# Patient Record
Sex: Female | Born: 1940 | Race: White | Hispanic: No | Smoking: Former smoker
Health system: Southern US, Community
[De-identification: ages and names within clinical notes are randomized; demographics above are authoritative.]

## PROBLEM LIST (undated history)

## (undated) DIAGNOSIS — Z8669 Personal history of other diseases of the nervous system and sense organs: Secondary | ICD-10-CM

## (undated) DIAGNOSIS — J449 Chronic obstructive pulmonary disease, unspecified: Secondary | ICD-10-CM

## (undated) DIAGNOSIS — E785 Hyperlipidemia, unspecified: Secondary | ICD-10-CM

## (undated) DIAGNOSIS — I10 Essential (primary) hypertension: Secondary | ICD-10-CM

## (undated) DIAGNOSIS — M1990 Unspecified osteoarthritis, unspecified site: Secondary | ICD-10-CM

## (undated) DIAGNOSIS — R209 Unspecified disturbances of skin sensation: Principal | ICD-10-CM

## (undated) DIAGNOSIS — K219 Gastro-esophageal reflux disease without esophagitis: Secondary | ICD-10-CM

## (undated) DIAGNOSIS — N189 Chronic kidney disease, unspecified: Secondary | ICD-10-CM

## (undated) HISTORY — DX: Hyperlipidemia, unspecified: E78.5

## (undated) HISTORY — DX: Essential (primary) hypertension: I10

## (undated) HISTORY — DX: Chronic obstructive pulmonary disease, unspecified: J44.9

## (undated) HISTORY — DX: Personal history of other diseases of the nervous system and sense organs: Z86.69

## (undated) HISTORY — DX: Unspecified osteoarthritis, unspecified site: M19.90

## (undated) HISTORY — PX: TUBAL LIGATION: SHX77

## (undated) HISTORY — DX: Chronic kidney disease, unspecified: N18.9

## (undated) HISTORY — PX: CHOLECYSTECTOMY: SHX55

## (undated) HISTORY — PX: TOTAL KNEE ARTHROPLASTY: SHX125

## (undated) HISTORY — DX: Unspecified disturbances of skin sensation: R20.9

## (undated) HISTORY — DX: Gastro-esophageal reflux disease without esophagitis: K21.9

## (undated) HISTORY — PX: OTHER SURGICAL HISTORY: SHX169

---

## 2013-10-31 ENCOUNTER — Encounter: Payer: Self-pay | Admitting: Neurology

## 2013-11-04 ENCOUNTER — Encounter: Payer: Self-pay | Admitting: Neurology

## 2013-11-04 ENCOUNTER — Ambulatory Visit (INDEPENDENT_AMBULATORY_CARE_PROVIDER_SITE_OTHER): Payer: Medicare Other | Admitting: Neurology

## 2013-11-04 VITALS — BP 139/80 | HR 62 | Ht 66.0 in | Wt 191.0 lb

## 2013-11-04 DIAGNOSIS — R209 Unspecified disturbances of skin sensation: Secondary | ICD-10-CM

## 2013-11-04 HISTORY — DX: Unspecified disturbances of skin sensation: R20.9

## 2013-11-04 NOTE — Progress Notes (Signed)
Reason for visit: Left hemisensory deficit  Kristin Petty is a 73 y.o. female  History of present illness:  Kristin Petty is a 73 year old right-handed white female with a history of hypertension and dyslipidemia. The patient noted onset of bilateral facial numbness that began 16 days prior to this evaluation. The patient had no other associated symptoms such as headache, double vision, slurred speech, or problems with swallowing. Within 3 days, she developed some numbness going down the left arm and the left leg. There is no associated clumsiness or weakness. She denies any gait instability issues. There was no confusion. Around that time, she was noted to have systolic blood pressures in the 210 or 220 range. The patient had an adjustment on her blood pressure medication since that time. She has had some gradual improvement of the numbness, but she still has some mild residual sensory changes around the mouth. The patient went to the emergency room, and a CAT scan of the brain was done and was unremarkable. The disc of the scan was brought for my review. The patient denies any neck pain. The patient is on low-dose aspirin, and was on aspirin at the time of the onset of his sensory deficit. She comes to this office for an evaluation.   Past Medical History  Diagnosis Date  . Hypertension   . Esophageal reflux   . Arthritis   . Disturbance of skin sensation 11/04/2013  . H/O Bell's palsy     right  . Dyslipidemia   . COPD (chronic obstructive pulmonary disease)   . Chronic renal insufficiency     Past Surgical History  Procedure Laterality Date  . Cholecystectomy    . Cesarean section    . Total knee arthroplasty    . Biopsy left breast    . Benign breast parenchyma    . Tubal ligation      Family History  Problem Relation Age of Onset  . Heart attack Father   . Alcoholism Father   . Alcoholism Mother     Social history:  reports that she has quit smoking. Her smoking use  included Cigarettes. She smoked 0.00 packs per day. She has never used smokeless tobacco. She reports that she does not drink alcohol or use illicit drugs.  Medications:  Current Outpatient Prescriptions on File Prior to Visit  Medication Sig Dispense Refill  . albuterol (PROAIR HFA) 108 (90 BASE) MCG/ACT inhaler Inhale 2 puffs into the lungs every 4 (four) hours as needed for wheezing or shortness of breath.      . Ascorbic Acid (VITAMIN C) 100 MG tablet Take 100 mg by mouth daily.      Marland Kitchen aspirin 325 MG tablet Take 325 mg by mouth daily.      . Calcium Carb-Cholecalciferol (CALCIUM 600 + D PO) Take 1 capsule by mouth 2 (two) times daily. 600 mg calcium + 400 vit d      . Fluticasone-Salmeterol (ADVAIR) 500-50 MCG/DOSE AEPB Inhale 1 puff into the lungs 2 (two) times daily.      . furosemide (LASIX) 20 MG tablet Take 20 mg by mouth daily. 10 mg daily      . ipratropium-albuterol (DUONEB) 0.5-2.5 (3) MG/3ML SOLN Take 3 mLs by nebulization every 4 (four) hours as needed (q 2-4 hrs prn).      . montelukast (SINGULAIR) 10 MG tablet Take 10 mg by mouth at bedtime.      . Omega-3 Fatty Acids (FISH OIL) 1000 MG CAPS Take  1 capsule by mouth daily.      . pravastatin (PRAVACHOL) 40 MG tablet Take 40 mg by mouth daily.      Marland Kitchen tiotropium (SPIRIVA) 18 MCG inhalation capsule Place 18 mcg into inhaler and inhale daily.       No current facility-administered medications on file prior to visit.      Allergies  Allergen Reactions  . Antihistamines, Chlorpheniramine-Type     ROS:  Out of a complete 14 system review of symptoms, the patient complains only of the following symptoms, and all other reviewed systems are negative.  Ringing in the ears  Shortness of breath, snoring Joint pain, muscle cramps Allergies Moles Numbness, tremor Sleepiness  Blood pressure 139/80, pulse 62, height 5\' 6"  (1.676 m), weight 191 lb (86.637 kg).  Physical Exam  General: The patient is alert and cooperative at  the time of the examination.  Eyes: Pupils are equal, round, and reactive to light. Discs are flat bilaterally.  Neck: The neck is supple, no carotid bruits are noted.  Respiratory: The respiratory examination is clear.  Cardiovascular: The cardiovascular examination reveals a regular rate and rhythm, no obvious murmurs or rubs are noted.  Skin: Extremities are without significant edema.  Neurologic Exam  Mental status: The patient is alert and oriented x 3 at the time of the examination. The patient has apparent normal recent and remote memory, with an apparently normal attention span and concentration ability.  Cranial nerves: Facial symmetry is present. There is good sensation of the face to pinprick and soft touch bilaterally. The strength of the facial muscles and the muscles to head turning and shoulder shrug are normal bilaterally. Speech is well enunciated, no aphasia or dysarthria is noted. Extraocular movements are full. Visual fields are full. The tongue is midline, and the patient has symmetric elevation of the soft palate. No obvious hearing deficits are noted.  Motor: The motor testing reveals 5 over 5 strength of all 4 extremities. Good symmetric motor tone is noted throughout.  Sensory: Sensory testing is intact to pinprick, soft touch, vibration sensation, and position sense on all 4 extremities, with exception that there is some decreased pinprick sensation on the left hand. No evidence of extinction is noted.  Coordination: Cerebellar testing reveals good finger-nose-finger and heel-to-shin bilaterally.  Gait and station: Gait is normal. Tandem gait is normal. Romberg is negative. No drift is seen.  Reflexes: Deep tendon reflexes are symmetric, but are depressed bilaterally. Toes are downgoing bilaterally.   Assessment/Plan:  One. Left hemisensory deficit  The patient had onset of a sensory deficit that came on over several days involving the left side of the body  primarily. The patient has had some issues with uncontrolled blood pressures around the time of sensory symptoms onset.  The patient may be at risk for small vessel disease affecting the brainstem area. She will be set up for MRI of the brain, and a carotid Doppler study. MRA of the head will be done. She will followup through this office if needed.   Jill Alexanders MD 11/04/2013 8:19 PM  [facility] [address] [place], [place] [address]  Phone [phone] Fax [phone]

## 2013-11-13 ENCOUNTER — Inpatient Hospital Stay: Admission: RE | Admit: 2013-11-13 | Payer: Self-pay | Source: Ambulatory Visit

## 2013-11-13 ENCOUNTER — Ambulatory Visit
Admission: RE | Admit: 2013-11-13 | Discharge: 2013-11-13 | Disposition: A | Discharge status: Home / Self Care | Payer: PRIVATE HEALTH INSURANCE | Source: Ambulatory Visit | Attending: Neurology | Admitting: Neurology

## 2013-11-13 DIAGNOSIS — R209 Unspecified disturbances of skin sensation: Secondary | ICD-10-CM

## 2013-11-19 ENCOUNTER — Ambulatory Visit (INDEPENDENT_AMBULATORY_CARE_PROVIDER_SITE_OTHER): Payer: Medicare Other

## 2013-11-19 ENCOUNTER — Telehealth: Payer: Self-pay | Admitting: Neurology

## 2013-11-19 DIAGNOSIS — R209 Unspecified disturbances of skin sensation: Secondary | ICD-10-CM

## 2013-11-19 MED ORDER — ALPRAZOLAM 0.5 MG PO TABS: ORAL_TABLET | ORAL | Status: DC

## 2013-11-19 NOTE — Telephone Encounter (Signed)
I called patient. The patient wants me to call in a small prescription for Xanax for the MRI study, I will do this.

## 2013-11-19 NOTE — Telephone Encounter (Signed)
Needs Xanax for MRI on monday

## 2013-11-19 NOTE — Telephone Encounter (Signed)
Patient requesting Xanax for scheduled MRI.

## 2013-11-24 ENCOUNTER — Ambulatory Visit
Admission: RE | Admit: 2013-11-24 | Discharge: 2013-11-24 | Disposition: A | Discharge status: Home / Self Care | Payer: PRIVATE HEALTH INSURANCE | Source: Ambulatory Visit | Attending: Neurology | Admitting: Neurology

## 2013-11-24 ENCOUNTER — Telehealth: Payer: Self-pay | Admitting: Neurology

## 2013-11-24 DIAGNOSIS — R209 Unspecified disturbances of skin sensation: Secondary | ICD-10-CM

## 2013-11-24 NOTE — Telephone Encounter (Signed)
I called patient. The carotid Doppler study shows 50-69% stenosis of the right mid internal carotid artery. No issues noted on the left. MRI and MRA of the head have been done, the formal results are pending. I will call the patient when I get the results. I'll recheck a carotid Doppler study in one year. The patient is on low-dose aspirin.

## 2013-11-25 ENCOUNTER — Telehealth: Payer: Self-pay | Admitting: Neurology

## 2013-11-25 NOTE — Telephone Encounter (Signed)
I called patient. MRI the brain shows nonspecific white matter changes, no acute changes seen, no acute stroke. The patient indicates that the sensory changes have improved once her blood pressure has come under good control. The patient likely had a vasospasm issue that resulted in the drainage and sensory alterations. Carotid Doppler study was unremarkable.     MRI brain 11/25/2013:  Impression   Abnormal MRI brain (without) demonstrating: 1. Few round and ovoid, periventricular and subcortical foci of T2  hyperintensities. These findings are non-specific and considerations  include autoimmune, inflammatory, post-infectious, or microvascular  ischemic etiologies. 2. No acute findings.   MRA head 11/25/2013:   IMPRESSION:  Normal MRA head (without).

## 2014-02-18 ENCOUNTER — Encounter: Payer: Self-pay | Admitting: Neurology

## 2014-02-24 ENCOUNTER — Encounter: Payer: Self-pay | Admitting: Neurology

## 2014-04-29 DIAGNOSIS — J449 Chronic obstructive pulmonary disease, unspecified: Secondary | ICD-10-CM | POA: Diagnosis not present

## 2014-05-15 DIAGNOSIS — R0781 Pleurodynia: Secondary | ICD-10-CM | POA: Diagnosis not present

## 2014-05-15 DIAGNOSIS — S76011D Strain of muscle, fascia and tendon of right hip, subsequent encounter: Secondary | ICD-10-CM | POA: Diagnosis not present

## 2014-05-22 DIAGNOSIS — M1611 Unilateral primary osteoarthritis, right hip: Secondary | ICD-10-CM | POA: Diagnosis not present

## 2014-05-28 DIAGNOSIS — D126 Benign neoplasm of colon, unspecified: Secondary | ICD-10-CM | POA: Diagnosis not present

## 2014-05-29 DIAGNOSIS — M25551 Pain in right hip: Secondary | ICD-10-CM | POA: Diagnosis not present

## 2014-05-29 DIAGNOSIS — R262 Difficulty in walking, not elsewhere classified: Secondary | ICD-10-CM | POA: Diagnosis not present

## 2014-05-29 DIAGNOSIS — M1611 Unilateral primary osteoarthritis, right hip: Secondary | ICD-10-CM | POA: Diagnosis not present

## 2014-05-30 DIAGNOSIS — J449 Chronic obstructive pulmonary disease, unspecified: Secondary | ICD-10-CM | POA: Diagnosis not present

## 2014-06-04 DIAGNOSIS — M169 Osteoarthritis of hip, unspecified: Secondary | ICD-10-CM | POA: Diagnosis not present

## 2014-06-04 DIAGNOSIS — M25551 Pain in right hip: Secondary | ICD-10-CM | POA: Diagnosis not present

## 2014-06-12 DIAGNOSIS — J45909 Unspecified asthma, uncomplicated: Secondary | ICD-10-CM | POA: Diagnosis not present

## 2014-06-12 DIAGNOSIS — Z9049 Acquired absence of other specified parts of digestive tract: Secondary | ICD-10-CM | POA: Diagnosis not present

## 2014-06-12 DIAGNOSIS — K6389 Other specified diseases of intestine: Secondary | ICD-10-CM | POA: Diagnosis not present

## 2014-06-12 DIAGNOSIS — Z86010 Personal history of colonic polyps: Secondary | ICD-10-CM | POA: Diagnosis not present

## 2014-06-12 DIAGNOSIS — I10 Essential (primary) hypertension: Secondary | ICD-10-CM | POA: Diagnosis not present

## 2014-06-12 DIAGNOSIS — K5730 Diverticulosis of large intestine without perforation or abscess without bleeding: Secondary | ICD-10-CM | POA: Diagnosis not present

## 2014-06-12 DIAGNOSIS — Z96659 Presence of unspecified artificial knee joint: Secondary | ICD-10-CM | POA: Diagnosis not present

## 2014-06-12 DIAGNOSIS — D124 Benign neoplasm of descending colon: Secondary | ICD-10-CM | POA: Diagnosis not present

## 2014-06-12 DIAGNOSIS — Z87891 Personal history of nicotine dependence: Secondary | ICD-10-CM | POA: Diagnosis not present

## 2014-06-19 DIAGNOSIS — M1611 Unilateral primary osteoarthritis, right hip: Secondary | ICD-10-CM | POA: Diagnosis not present

## 2014-06-19 DIAGNOSIS — M25551 Pain in right hip: Secondary | ICD-10-CM | POA: Diagnosis not present

## 2014-06-19 DIAGNOSIS — R262 Difficulty in walking, not elsewhere classified: Secondary | ICD-10-CM | POA: Diagnosis not present

## 2014-06-25 DIAGNOSIS — M1611 Unilateral primary osteoarthritis, right hip: Secondary | ICD-10-CM | POA: Diagnosis not present

## 2014-06-25 DIAGNOSIS — R262 Difficulty in walking, not elsewhere classified: Secondary | ICD-10-CM | POA: Diagnosis not present

## 2014-06-25 DIAGNOSIS — M25551 Pain in right hip: Secondary | ICD-10-CM | POA: Diagnosis not present

## 2014-06-28 DIAGNOSIS — J449 Chronic obstructive pulmonary disease, unspecified: Secondary | ICD-10-CM | POA: Diagnosis not present

## 2014-07-03 DIAGNOSIS — M25551 Pain in right hip: Secondary | ICD-10-CM | POA: Diagnosis not present

## 2014-07-03 DIAGNOSIS — R262 Difficulty in walking, not elsewhere classified: Secondary | ICD-10-CM | POA: Diagnosis not present

## 2014-07-03 DIAGNOSIS — M1611 Unilateral primary osteoarthritis, right hip: Secondary | ICD-10-CM | POA: Diagnosis not present

## 2014-07-10 DIAGNOSIS — R262 Difficulty in walking, not elsewhere classified: Secondary | ICD-10-CM | POA: Diagnosis not present

## 2014-07-10 DIAGNOSIS — M1611 Unilateral primary osteoarthritis, right hip: Secondary | ICD-10-CM | POA: Diagnosis not present

## 2014-07-10 DIAGNOSIS — M25551 Pain in right hip: Secondary | ICD-10-CM | POA: Diagnosis not present

## 2014-07-17 DIAGNOSIS — M25551 Pain in right hip: Secondary | ICD-10-CM | POA: Diagnosis not present

## 2014-07-17 DIAGNOSIS — R262 Difficulty in walking, not elsewhere classified: Secondary | ICD-10-CM | POA: Diagnosis not present

## 2014-07-17 DIAGNOSIS — M1611 Unilateral primary osteoarthritis, right hip: Secondary | ICD-10-CM | POA: Diagnosis not present

## 2014-07-24 DIAGNOSIS — R262 Difficulty in walking, not elsewhere classified: Secondary | ICD-10-CM | POA: Diagnosis not present

## 2014-07-24 DIAGNOSIS — M25551 Pain in right hip: Secondary | ICD-10-CM | POA: Diagnosis not present

## 2014-07-24 DIAGNOSIS — M1611 Unilateral primary osteoarthritis, right hip: Secondary | ICD-10-CM | POA: Diagnosis not present

## 2014-07-29 DIAGNOSIS — J449 Chronic obstructive pulmonary disease, unspecified: Secondary | ICD-10-CM | POA: Diagnosis not present

## 2014-08-28 DIAGNOSIS — J449 Chronic obstructive pulmonary disease, unspecified: Secondary | ICD-10-CM | POA: Diagnosis not present

## 2014-09-25 DIAGNOSIS — Z1231 Encounter for screening mammogram for malignant neoplasm of breast: Secondary | ICD-10-CM | POA: Diagnosis not present

## 2014-09-28 DIAGNOSIS — J449 Chronic obstructive pulmonary disease, unspecified: Secondary | ICD-10-CM | POA: Diagnosis not present

## 2014-10-20 DIAGNOSIS — J449 Chronic obstructive pulmonary disease, unspecified: Secondary | ICD-10-CM | POA: Diagnosis not present

## 2014-10-23 DIAGNOSIS — E780 Pure hypercholesterolemia: Secondary | ICD-10-CM | POA: Diagnosis not present

## 2014-10-23 DIAGNOSIS — Z0000 Encounter for general adult medical examination without abnormal findings: Secondary | ICD-10-CM | POA: Diagnosis not present

## 2014-10-23 DIAGNOSIS — I10 Essential (primary) hypertension: Secondary | ICD-10-CM | POA: Diagnosis not present

## 2014-10-23 DIAGNOSIS — Z79899 Other long term (current) drug therapy: Secondary | ICD-10-CM | POA: Diagnosis not present

## 2014-10-23 DIAGNOSIS — R0600 Dyspnea, unspecified: Secondary | ICD-10-CM | POA: Diagnosis not present

## 2014-10-28 DIAGNOSIS — J449 Chronic obstructive pulmonary disease, unspecified: Secondary | ICD-10-CM | POA: Diagnosis not present

## 2014-11-25 ENCOUNTER — Telehealth: Payer: Self-pay | Admitting: Neurology

## 2014-11-25 DIAGNOSIS — I6529 Occlusion and stenosis of unspecified carotid artery: Secondary | ICD-10-CM

## 2014-11-25 NOTE — Telephone Encounter (Signed)
I attempted to contact patient to reschedule carotid Doppler study for follow-up, both telephone numbers in epic are nonfunctional.

## 2014-11-26 NOTE — Telephone Encounter (Signed)
I called the patient and explained why Dr. Jannifer Franklin was calling. I let her know that Dr. Jannifer Franklin is out of the office until Monday. I explained that she would receive a call from Seth Bake to schedule the study once Dr. Jannifer Franklin places the order for it. She verbalized understanding.

## 2014-11-26 NOTE — Addendum Note (Signed)
Addended by: Margette Fast on: 11/26/2014 05:33 PM   Modules accepted: Orders

## 2014-11-26 NOTE — Telephone Encounter (Signed)
I called the patient. I discussed rechecking a carotid Doppler study. She is amenable to this. I will order the study.

## 2014-11-26 NOTE — Telephone Encounter (Signed)
The patient is returning a call and can be reached at [phone].

## 2014-11-28 DIAGNOSIS — J449 Chronic obstructive pulmonary disease, unspecified: Secondary | ICD-10-CM | POA: Diagnosis not present

## 2014-12-17 ENCOUNTER — Ambulatory Visit (INDEPENDENT_AMBULATORY_CARE_PROVIDER_SITE_OTHER): Payer: Medicare Other

## 2014-12-17 DIAGNOSIS — I6529 Occlusion and stenosis of unspecified carotid artery: Secondary | ICD-10-CM | POA: Diagnosis not present

## 2014-12-25 ENCOUNTER — Telehealth: Payer: Self-pay | Admitting: Neurology

## 2014-12-25 NOTE — Telephone Encounter (Signed)
Carotid Doppler study shows a stable stenosis of 50-69% stenosis in the right mid to proximal internal carotid artery. I discussed this with the patient.

## 2014-12-29 DIAGNOSIS — J449 Chronic obstructive pulmonary disease, unspecified: Secondary | ICD-10-CM | POA: Diagnosis not present

## 2015-01-28 DIAGNOSIS — J449 Chronic obstructive pulmonary disease, unspecified: Secondary | ICD-10-CM | POA: Diagnosis not present

## 2015-02-28 DIAGNOSIS — J449 Chronic obstructive pulmonary disease, unspecified: Secondary | ICD-10-CM | POA: Diagnosis not present

## 2015-03-22 DIAGNOSIS — E785 Hyperlipidemia, unspecified: Secondary | ICD-10-CM | POA: Diagnosis not present

## 2015-03-22 DIAGNOSIS — Z1389 Encounter for screening for other disorder: Secondary | ICD-10-CM | POA: Diagnosis not present

## 2015-03-22 DIAGNOSIS — Z79899 Other long term (current) drug therapy: Secondary | ICD-10-CM | POA: Diagnosis not present

## 2015-03-22 DIAGNOSIS — M25552 Pain in left hip: Secondary | ICD-10-CM | POA: Diagnosis not present

## 2015-03-30 DIAGNOSIS — J449 Chronic obstructive pulmonary disease, unspecified: Secondary | ICD-10-CM | POA: Diagnosis not present

## 2015-04-01 DIAGNOSIS — M1612 Unilateral primary osteoarthritis, left hip: Secondary | ICD-10-CM | POA: Diagnosis not present

## 2015-04-21 DIAGNOSIS — Z1389 Encounter for screening for other disorder: Secondary | ICD-10-CM | POA: Diagnosis not present

## 2015-04-21 DIAGNOSIS — M25552 Pain in left hip: Secondary | ICD-10-CM | POA: Diagnosis not present

## 2015-04-21 DIAGNOSIS — M79609 Pain in unspecified limb: Secondary | ICD-10-CM | POA: Diagnosis not present

## 2015-04-21 DIAGNOSIS — Z01810 Encounter for preprocedural cardiovascular examination: Secondary | ICD-10-CM | POA: Diagnosis not present

## 2015-04-21 DIAGNOSIS — Z79899 Other long term (current) drug therapy: Secondary | ICD-10-CM | POA: Diagnosis not present

## 2015-04-21 DIAGNOSIS — R9431 Abnormal electrocardiogram [ECG] [EKG]: Secondary | ICD-10-CM | POA: Diagnosis not present

## 2015-04-21 DIAGNOSIS — I4510 Unspecified right bundle-branch block: Secondary | ICD-10-CM | POA: Diagnosis not present

## 2015-04-21 DIAGNOSIS — Z01818 Encounter for other preprocedural examination: Secondary | ICD-10-CM | POA: Diagnosis not present

## 2015-04-21 DIAGNOSIS — R001 Bradycardia, unspecified: Secondary | ICD-10-CM | POA: Diagnosis not present

## 2015-04-22 DIAGNOSIS — I10 Essential (primary) hypertension: Secondary | ICD-10-CM | POA: Diagnosis not present

## 2015-04-22 DIAGNOSIS — E785 Hyperlipidemia, unspecified: Secondary | ICD-10-CM | POA: Diagnosis not present

## 2015-04-22 DIAGNOSIS — E7800 Pure hypercholesterolemia, unspecified: Secondary | ICD-10-CM | POA: Insufficient documentation

## 2015-04-22 DIAGNOSIS — Z87891 Personal history of nicotine dependence: Secondary | ICD-10-CM | POA: Diagnosis not present

## 2015-04-22 DIAGNOSIS — J410 Simple chronic bronchitis: Secondary | ICD-10-CM | POA: Diagnosis not present

## 2015-04-22 DIAGNOSIS — Z01810 Encounter for preprocedural cardiovascular examination: Secondary | ICD-10-CM | POA: Diagnosis not present

## 2015-04-27 DIAGNOSIS — J410 Simple chronic bronchitis: Secondary | ICD-10-CM | POA: Diagnosis not present

## 2015-04-27 DIAGNOSIS — E785 Hyperlipidemia, unspecified: Secondary | ICD-10-CM | POA: Diagnosis not present

## 2015-04-27 DIAGNOSIS — I10 Essential (primary) hypertension: Secondary | ICD-10-CM | POA: Diagnosis not present

## 2015-04-27 DIAGNOSIS — Z01810 Encounter for preprocedural cardiovascular examination: Secondary | ICD-10-CM | POA: Diagnosis not present

## 2015-04-27 DIAGNOSIS — Z87891 Personal history of nicotine dependence: Secondary | ICD-10-CM | POA: Diagnosis not present

## 2015-04-30 DIAGNOSIS — J449 Chronic obstructive pulmonary disease, unspecified: Secondary | ICD-10-CM | POA: Diagnosis not present

## 2015-05-10 DIAGNOSIS — M1612 Unilateral primary osteoarthritis, left hip: Secondary | ICD-10-CM | POA: Diagnosis not present

## 2015-05-19 DIAGNOSIS — M1652 Unilateral post-traumatic osteoarthritis, left hip: Secondary | ICD-10-CM | POA: Diagnosis not present

## 2015-05-19 DIAGNOSIS — Z7982 Long term (current) use of aspirin: Secondary | ICD-10-CM | POA: Diagnosis not present

## 2015-05-19 DIAGNOSIS — Z87891 Personal history of nicotine dependence: Secondary | ICD-10-CM | POA: Diagnosis not present

## 2015-05-19 DIAGNOSIS — Z471 Aftercare following joint replacement surgery: Secondary | ICD-10-CM | POA: Diagnosis not present

## 2015-05-19 DIAGNOSIS — Z96642 Presence of left artificial hip joint: Secondary | ICD-10-CM | POA: Diagnosis not present

## 2015-05-19 DIAGNOSIS — E785 Hyperlipidemia, unspecified: Secondary | ICD-10-CM | POA: Diagnosis not present

## 2015-05-19 DIAGNOSIS — M25552 Pain in left hip: Secondary | ICD-10-CM | POA: Diagnosis not present

## 2015-05-19 DIAGNOSIS — K219 Gastro-esophageal reflux disease without esophagitis: Secondary | ICD-10-CM | POA: Diagnosis not present

## 2015-05-19 DIAGNOSIS — E559 Vitamin D deficiency, unspecified: Secondary | ICD-10-CM | POA: Diagnosis not present

## 2015-05-19 DIAGNOSIS — J449 Chronic obstructive pulmonary disease, unspecified: Secondary | ICD-10-CM | POA: Diagnosis not present

## 2015-05-19 DIAGNOSIS — Z79899 Other long term (current) drug therapy: Secondary | ICD-10-CM | POA: Diagnosis not present

## 2015-05-19 DIAGNOSIS — M1612 Unilateral primary osteoarthritis, left hip: Secondary | ICD-10-CM | POA: Diagnosis not present

## 2015-05-19 DIAGNOSIS — J45909 Unspecified asthma, uncomplicated: Secondary | ICD-10-CM | POA: Diagnosis not present

## 2015-05-19 DIAGNOSIS — M1712 Unilateral primary osteoarthritis, left knee: Secondary | ICD-10-CM | POA: Diagnosis not present

## 2015-05-19 DIAGNOSIS — Z888 Allergy status to other drugs, medicaments and biological substances status: Secondary | ICD-10-CM | POA: Diagnosis not present

## 2015-05-19 DIAGNOSIS — E7800 Pure hypercholesterolemia, unspecified: Secondary | ICD-10-CM | POA: Diagnosis not present

## 2015-05-21 DIAGNOSIS — M1652 Unilateral post-traumatic osteoarthritis, left hip: Secondary | ICD-10-CM | POA: Diagnosis not present

## 2015-05-21 DIAGNOSIS — G8918 Other acute postprocedural pain: Secondary | ICD-10-CM | POA: Diagnosis not present

## 2015-05-21 DIAGNOSIS — J9621 Acute and chronic respiratory failure with hypoxia: Secondary | ICD-10-CM | POA: Diagnosis not present

## 2015-05-21 DIAGNOSIS — M25552 Pain in left hip: Secondary | ICD-10-CM | POA: Diagnosis not present

## 2015-05-21 DIAGNOSIS — Z471 Aftercare following joint replacement surgery: Secondary | ICD-10-CM | POA: Diagnosis not present

## 2015-05-21 DIAGNOSIS — J449 Chronic obstructive pulmonary disease, unspecified: Secondary | ICD-10-CM | POA: Diagnosis not present

## 2015-05-21 DIAGNOSIS — Z96642 Presence of left artificial hip joint: Secondary | ICD-10-CM | POA: Diagnosis not present

## 2015-05-21 DIAGNOSIS — D649 Anemia, unspecified: Secondary | ICD-10-CM | POA: Diagnosis not present

## 2015-05-24 DIAGNOSIS — Z96642 Presence of left artificial hip joint: Secondary | ICD-10-CM | POA: Diagnosis not present

## 2015-05-28 DIAGNOSIS — D649 Anemia, unspecified: Secondary | ICD-10-CM | POA: Diagnosis not present

## 2015-05-28 DIAGNOSIS — Z96642 Presence of left artificial hip joint: Secondary | ICD-10-CM | POA: Diagnosis not present

## 2015-05-28 DIAGNOSIS — G8918 Other acute postprocedural pain: Secondary | ICD-10-CM | POA: Diagnosis not present

## 2015-05-28 DIAGNOSIS — J9621 Acute and chronic respiratory failure with hypoxia: Secondary | ICD-10-CM | POA: Diagnosis not present

## 2015-06-03 DIAGNOSIS — Z96642 Presence of left artificial hip joint: Secondary | ICD-10-CM | POA: Diagnosis not present

## 2015-06-03 DIAGNOSIS — Z7982 Long term (current) use of aspirin: Secondary | ICD-10-CM | POA: Diagnosis not present

## 2015-06-03 DIAGNOSIS — G2581 Restless legs syndrome: Secondary | ICD-10-CM | POA: Diagnosis not present

## 2015-06-03 DIAGNOSIS — J45909 Unspecified asthma, uncomplicated: Secondary | ICD-10-CM | POA: Diagnosis not present

## 2015-06-03 DIAGNOSIS — Z4801 Encounter for change or removal of surgical wound dressing: Secondary | ICD-10-CM | POA: Diagnosis not present

## 2015-06-03 DIAGNOSIS — Z87891 Personal history of nicotine dependence: Secondary | ICD-10-CM | POA: Diagnosis not present

## 2015-06-03 DIAGNOSIS — Z9981 Dependence on supplemental oxygen: Secondary | ICD-10-CM | POA: Diagnosis not present

## 2015-06-03 DIAGNOSIS — Z471 Aftercare following joint replacement surgery: Secondary | ICD-10-CM | POA: Diagnosis not present

## 2015-06-03 DIAGNOSIS — M810 Age-related osteoporosis without current pathological fracture: Secondary | ICD-10-CM | POA: Diagnosis not present

## 2015-06-03 DIAGNOSIS — Z79891 Long term (current) use of opiate analgesic: Secondary | ICD-10-CM | POA: Diagnosis not present

## 2015-06-03 DIAGNOSIS — I10 Essential (primary) hypertension: Secondary | ICD-10-CM | POA: Diagnosis not present

## 2015-06-03 DIAGNOSIS — J449 Chronic obstructive pulmonary disease, unspecified: Secondary | ICD-10-CM | POA: Diagnosis not present

## 2015-06-04 DIAGNOSIS — Z471 Aftercare following joint replacement surgery: Secondary | ICD-10-CM | POA: Diagnosis not present

## 2015-06-04 DIAGNOSIS — Z7982 Long term (current) use of aspirin: Secondary | ICD-10-CM | POA: Diagnosis not present

## 2015-06-04 DIAGNOSIS — Z4801 Encounter for change or removal of surgical wound dressing: Secondary | ICD-10-CM | POA: Diagnosis not present

## 2015-06-04 DIAGNOSIS — G2581 Restless legs syndrome: Secondary | ICD-10-CM | POA: Diagnosis not present

## 2015-06-04 DIAGNOSIS — J449 Chronic obstructive pulmonary disease, unspecified: Secondary | ICD-10-CM | POA: Diagnosis not present

## 2015-06-04 DIAGNOSIS — Z96642 Presence of left artificial hip joint: Secondary | ICD-10-CM | POA: Diagnosis not present

## 2015-06-04 DIAGNOSIS — Z9981 Dependence on supplemental oxygen: Secondary | ICD-10-CM | POA: Diagnosis not present

## 2015-06-04 DIAGNOSIS — Z79891 Long term (current) use of opiate analgesic: Secondary | ICD-10-CM | POA: Diagnosis not present

## 2015-06-04 DIAGNOSIS — I10 Essential (primary) hypertension: Secondary | ICD-10-CM | POA: Diagnosis not present

## 2015-06-04 DIAGNOSIS — J45909 Unspecified asthma, uncomplicated: Secondary | ICD-10-CM | POA: Diagnosis not present

## 2015-06-04 DIAGNOSIS — M810 Age-related osteoporosis without current pathological fracture: Secondary | ICD-10-CM | POA: Diagnosis not present

## 2015-06-04 DIAGNOSIS — Z87891 Personal history of nicotine dependence: Secondary | ICD-10-CM | POA: Diagnosis not present

## 2015-06-07 DIAGNOSIS — Z79891 Long term (current) use of opiate analgesic: Secondary | ICD-10-CM | POA: Diagnosis not present

## 2015-06-07 DIAGNOSIS — Z9981 Dependence on supplemental oxygen: Secondary | ICD-10-CM | POA: Diagnosis not present

## 2015-06-07 DIAGNOSIS — I10 Essential (primary) hypertension: Secondary | ICD-10-CM | POA: Diagnosis not present

## 2015-06-07 DIAGNOSIS — Z471 Aftercare following joint replacement surgery: Secondary | ICD-10-CM | POA: Diagnosis not present

## 2015-06-07 DIAGNOSIS — Z4801 Encounter for change or removal of surgical wound dressing: Secondary | ICD-10-CM | POA: Diagnosis not present

## 2015-06-07 DIAGNOSIS — J449 Chronic obstructive pulmonary disease, unspecified: Secondary | ICD-10-CM | POA: Diagnosis not present

## 2015-06-07 DIAGNOSIS — G2581 Restless legs syndrome: Secondary | ICD-10-CM | POA: Diagnosis not present

## 2015-06-07 DIAGNOSIS — Z7982 Long term (current) use of aspirin: Secondary | ICD-10-CM | POA: Diagnosis not present

## 2015-06-07 DIAGNOSIS — Z96642 Presence of left artificial hip joint: Secondary | ICD-10-CM | POA: Diagnosis not present

## 2015-06-07 DIAGNOSIS — J45909 Unspecified asthma, uncomplicated: Secondary | ICD-10-CM | POA: Diagnosis not present

## 2015-06-07 DIAGNOSIS — M810 Age-related osteoporosis without current pathological fracture: Secondary | ICD-10-CM | POA: Diagnosis not present

## 2015-06-07 DIAGNOSIS — Z87891 Personal history of nicotine dependence: Secondary | ICD-10-CM | POA: Diagnosis not present

## 2015-06-09 DIAGNOSIS — J45909 Unspecified asthma, uncomplicated: Secondary | ICD-10-CM | POA: Diagnosis not present

## 2015-06-09 DIAGNOSIS — Z4801 Encounter for change or removal of surgical wound dressing: Secondary | ICD-10-CM | POA: Diagnosis not present

## 2015-06-09 DIAGNOSIS — Z9981 Dependence on supplemental oxygen: Secondary | ICD-10-CM | POA: Diagnosis not present

## 2015-06-09 DIAGNOSIS — I10 Essential (primary) hypertension: Secondary | ICD-10-CM | POA: Diagnosis not present

## 2015-06-09 DIAGNOSIS — M810 Age-related osteoporosis without current pathological fracture: Secondary | ICD-10-CM | POA: Diagnosis not present

## 2015-06-09 DIAGNOSIS — Z79891 Long term (current) use of opiate analgesic: Secondary | ICD-10-CM | POA: Diagnosis not present

## 2015-06-09 DIAGNOSIS — Z471 Aftercare following joint replacement surgery: Secondary | ICD-10-CM | POA: Diagnosis not present

## 2015-06-09 DIAGNOSIS — Z87891 Personal history of nicotine dependence: Secondary | ICD-10-CM | POA: Diagnosis not present

## 2015-06-09 DIAGNOSIS — Z96642 Presence of left artificial hip joint: Secondary | ICD-10-CM | POA: Diagnosis not present

## 2015-06-09 DIAGNOSIS — J449 Chronic obstructive pulmonary disease, unspecified: Secondary | ICD-10-CM | POA: Diagnosis not present

## 2015-06-09 DIAGNOSIS — G2581 Restless legs syndrome: Secondary | ICD-10-CM | POA: Diagnosis not present

## 2015-06-09 DIAGNOSIS — Z7982 Long term (current) use of aspirin: Secondary | ICD-10-CM | POA: Diagnosis not present

## 2015-06-10 DIAGNOSIS — Z96642 Presence of left artificial hip joint: Secondary | ICD-10-CM | POA: Diagnosis not present

## 2015-06-10 DIAGNOSIS — Z4801 Encounter for change or removal of surgical wound dressing: Secondary | ICD-10-CM | POA: Diagnosis not present

## 2015-06-10 DIAGNOSIS — Z79891 Long term (current) use of opiate analgesic: Secondary | ICD-10-CM | POA: Diagnosis not present

## 2015-06-10 DIAGNOSIS — Z471 Aftercare following joint replacement surgery: Secondary | ICD-10-CM | POA: Diagnosis not present

## 2015-06-10 DIAGNOSIS — Z87891 Personal history of nicotine dependence: Secondary | ICD-10-CM | POA: Diagnosis not present

## 2015-06-10 DIAGNOSIS — J45909 Unspecified asthma, uncomplicated: Secondary | ICD-10-CM | POA: Diagnosis not present

## 2015-06-10 DIAGNOSIS — Z9981 Dependence on supplemental oxygen: Secondary | ICD-10-CM | POA: Diagnosis not present

## 2015-06-10 DIAGNOSIS — I10 Essential (primary) hypertension: Secondary | ICD-10-CM | POA: Diagnosis not present

## 2015-06-10 DIAGNOSIS — M810 Age-related osteoporosis without current pathological fracture: Secondary | ICD-10-CM | POA: Diagnosis not present

## 2015-06-10 DIAGNOSIS — J449 Chronic obstructive pulmonary disease, unspecified: Secondary | ICD-10-CM | POA: Diagnosis not present

## 2015-06-10 DIAGNOSIS — Z7982 Long term (current) use of aspirin: Secondary | ICD-10-CM | POA: Diagnosis not present

## 2015-06-10 DIAGNOSIS — G2581 Restless legs syndrome: Secondary | ICD-10-CM | POA: Diagnosis not present

## 2015-06-15 DIAGNOSIS — Z96642 Presence of left artificial hip joint: Secondary | ICD-10-CM | POA: Diagnosis not present

## 2015-06-15 DIAGNOSIS — J449 Chronic obstructive pulmonary disease, unspecified: Secondary | ICD-10-CM | POA: Diagnosis not present

## 2015-06-15 DIAGNOSIS — Z7982 Long term (current) use of aspirin: Secondary | ICD-10-CM | POA: Diagnosis not present

## 2015-06-15 DIAGNOSIS — Z79891 Long term (current) use of opiate analgesic: Secondary | ICD-10-CM | POA: Diagnosis not present

## 2015-06-15 DIAGNOSIS — Z4801 Encounter for change or removal of surgical wound dressing: Secondary | ICD-10-CM | POA: Diagnosis not present

## 2015-06-15 DIAGNOSIS — Z9981 Dependence on supplemental oxygen: Secondary | ICD-10-CM | POA: Diagnosis not present

## 2015-06-15 DIAGNOSIS — Z87891 Personal history of nicotine dependence: Secondary | ICD-10-CM | POA: Diagnosis not present

## 2015-06-15 DIAGNOSIS — Z471 Aftercare following joint replacement surgery: Secondary | ICD-10-CM | POA: Diagnosis not present

## 2015-06-15 DIAGNOSIS — J45909 Unspecified asthma, uncomplicated: Secondary | ICD-10-CM | POA: Diagnosis not present

## 2015-06-15 DIAGNOSIS — G2581 Restless legs syndrome: Secondary | ICD-10-CM | POA: Diagnosis not present

## 2015-06-15 DIAGNOSIS — I10 Essential (primary) hypertension: Secondary | ICD-10-CM | POA: Diagnosis not present

## 2015-06-15 DIAGNOSIS — M810 Age-related osteoporosis without current pathological fracture: Secondary | ICD-10-CM | POA: Diagnosis not present

## 2015-06-16 DIAGNOSIS — Z1389 Encounter for screening for other disorder: Secondary | ICD-10-CM | POA: Diagnosis not present

## 2015-06-16 DIAGNOSIS — Z96649 Presence of unspecified artificial hip joint: Secondary | ICD-10-CM | POA: Diagnosis not present

## 2015-06-17 DIAGNOSIS — Z9981 Dependence on supplemental oxygen: Secondary | ICD-10-CM | POA: Diagnosis not present

## 2015-06-17 DIAGNOSIS — I10 Essential (primary) hypertension: Secondary | ICD-10-CM | POA: Diagnosis not present

## 2015-06-17 DIAGNOSIS — Z471 Aftercare following joint replacement surgery: Secondary | ICD-10-CM | POA: Diagnosis not present

## 2015-06-17 DIAGNOSIS — Z4801 Encounter for change or removal of surgical wound dressing: Secondary | ICD-10-CM | POA: Diagnosis not present

## 2015-06-17 DIAGNOSIS — J449 Chronic obstructive pulmonary disease, unspecified: Secondary | ICD-10-CM | POA: Diagnosis not present

## 2015-06-17 DIAGNOSIS — Z7982 Long term (current) use of aspirin: Secondary | ICD-10-CM | POA: Diagnosis not present

## 2015-06-17 DIAGNOSIS — G2581 Restless legs syndrome: Secondary | ICD-10-CM | POA: Diagnosis not present

## 2015-06-17 DIAGNOSIS — M810 Age-related osteoporosis without current pathological fracture: Secondary | ICD-10-CM | POA: Diagnosis not present

## 2015-06-17 DIAGNOSIS — J45909 Unspecified asthma, uncomplicated: Secondary | ICD-10-CM | POA: Diagnosis not present

## 2015-06-17 DIAGNOSIS — Z79891 Long term (current) use of opiate analgesic: Secondary | ICD-10-CM | POA: Diagnosis not present

## 2015-06-17 DIAGNOSIS — Z87891 Personal history of nicotine dependence: Secondary | ICD-10-CM | POA: Diagnosis not present

## 2015-06-17 DIAGNOSIS — Z96642 Presence of left artificial hip joint: Secondary | ICD-10-CM | POA: Diagnosis not present

## 2015-06-21 DIAGNOSIS — Z87891 Personal history of nicotine dependence: Secondary | ICD-10-CM | POA: Diagnosis not present

## 2015-06-21 DIAGNOSIS — M810 Age-related osteoporosis without current pathological fracture: Secondary | ICD-10-CM | POA: Diagnosis not present

## 2015-06-21 DIAGNOSIS — Z471 Aftercare following joint replacement surgery: Secondary | ICD-10-CM | POA: Diagnosis not present

## 2015-06-21 DIAGNOSIS — I10 Essential (primary) hypertension: Secondary | ICD-10-CM | POA: Diagnosis not present

## 2015-06-21 DIAGNOSIS — Z7982 Long term (current) use of aspirin: Secondary | ICD-10-CM | POA: Diagnosis not present

## 2015-06-21 DIAGNOSIS — G2581 Restless legs syndrome: Secondary | ICD-10-CM | POA: Diagnosis not present

## 2015-06-21 DIAGNOSIS — J449 Chronic obstructive pulmonary disease, unspecified: Secondary | ICD-10-CM | POA: Diagnosis not present

## 2015-06-21 DIAGNOSIS — Z9981 Dependence on supplemental oxygen: Secondary | ICD-10-CM | POA: Diagnosis not present

## 2015-06-21 DIAGNOSIS — J45909 Unspecified asthma, uncomplicated: Secondary | ICD-10-CM | POA: Diagnosis not present

## 2015-06-21 DIAGNOSIS — Z79891 Long term (current) use of opiate analgesic: Secondary | ICD-10-CM | POA: Diagnosis not present

## 2015-06-21 DIAGNOSIS — Z4801 Encounter for change or removal of surgical wound dressing: Secondary | ICD-10-CM | POA: Diagnosis not present

## 2015-06-21 DIAGNOSIS — Z96642 Presence of left artificial hip joint: Secondary | ICD-10-CM | POA: Diagnosis not present

## 2015-06-23 DIAGNOSIS — Z96642 Presence of left artificial hip joint: Secondary | ICD-10-CM | POA: Diagnosis not present

## 2015-06-23 DIAGNOSIS — G2581 Restless legs syndrome: Secondary | ICD-10-CM | POA: Diagnosis not present

## 2015-06-23 DIAGNOSIS — Z471 Aftercare following joint replacement surgery: Secondary | ICD-10-CM | POA: Diagnosis not present

## 2015-06-23 DIAGNOSIS — Z7982 Long term (current) use of aspirin: Secondary | ICD-10-CM | POA: Diagnosis not present

## 2015-06-23 DIAGNOSIS — J45909 Unspecified asthma, uncomplicated: Secondary | ICD-10-CM | POA: Diagnosis not present

## 2015-06-23 DIAGNOSIS — Z4801 Encounter for change or removal of surgical wound dressing: Secondary | ICD-10-CM | POA: Diagnosis not present

## 2015-06-23 DIAGNOSIS — J449 Chronic obstructive pulmonary disease, unspecified: Secondary | ICD-10-CM | POA: Diagnosis not present

## 2015-06-23 DIAGNOSIS — Z9981 Dependence on supplemental oxygen: Secondary | ICD-10-CM | POA: Diagnosis not present

## 2015-06-23 DIAGNOSIS — Z79891 Long term (current) use of opiate analgesic: Secondary | ICD-10-CM | POA: Diagnosis not present

## 2015-06-23 DIAGNOSIS — Z87891 Personal history of nicotine dependence: Secondary | ICD-10-CM | POA: Diagnosis not present

## 2015-06-23 DIAGNOSIS — I10 Essential (primary) hypertension: Secondary | ICD-10-CM | POA: Diagnosis not present

## 2015-06-23 DIAGNOSIS — M810 Age-related osteoporosis without current pathological fracture: Secondary | ICD-10-CM | POA: Diagnosis not present

## 2015-06-24 DIAGNOSIS — Z471 Aftercare following joint replacement surgery: Secondary | ICD-10-CM | POA: Diagnosis not present

## 2015-06-28 DIAGNOSIS — Z471 Aftercare following joint replacement surgery: Secondary | ICD-10-CM | POA: Diagnosis not present

## 2015-06-28 DIAGNOSIS — G2581 Restless legs syndrome: Secondary | ICD-10-CM | POA: Diagnosis not present

## 2015-06-28 DIAGNOSIS — Z4801 Encounter for change or removal of surgical wound dressing: Secondary | ICD-10-CM | POA: Diagnosis not present

## 2015-06-28 DIAGNOSIS — Z87891 Personal history of nicotine dependence: Secondary | ICD-10-CM | POA: Diagnosis not present

## 2015-06-28 DIAGNOSIS — Z96642 Presence of left artificial hip joint: Secondary | ICD-10-CM | POA: Diagnosis not present

## 2015-06-28 DIAGNOSIS — I10 Essential (primary) hypertension: Secondary | ICD-10-CM | POA: Diagnosis not present

## 2015-06-28 DIAGNOSIS — J45909 Unspecified asthma, uncomplicated: Secondary | ICD-10-CM | POA: Diagnosis not present

## 2015-06-28 DIAGNOSIS — Z79891 Long term (current) use of opiate analgesic: Secondary | ICD-10-CM | POA: Diagnosis not present

## 2015-06-28 DIAGNOSIS — M810 Age-related osteoporosis without current pathological fracture: Secondary | ICD-10-CM | POA: Diagnosis not present

## 2015-06-28 DIAGNOSIS — Z7982 Long term (current) use of aspirin: Secondary | ICD-10-CM | POA: Diagnosis not present

## 2015-06-28 DIAGNOSIS — Z9981 Dependence on supplemental oxygen: Secondary | ICD-10-CM | POA: Diagnosis not present

## 2015-06-28 DIAGNOSIS — J449 Chronic obstructive pulmonary disease, unspecified: Secondary | ICD-10-CM | POA: Diagnosis not present

## 2015-06-30 DIAGNOSIS — J449 Chronic obstructive pulmonary disease, unspecified: Secondary | ICD-10-CM | POA: Diagnosis not present

## 2015-06-30 DIAGNOSIS — M810 Age-related osteoporosis without current pathological fracture: Secondary | ICD-10-CM | POA: Diagnosis not present

## 2015-06-30 DIAGNOSIS — Z96642 Presence of left artificial hip joint: Secondary | ICD-10-CM | POA: Diagnosis not present

## 2015-06-30 DIAGNOSIS — Z9981 Dependence on supplemental oxygen: Secondary | ICD-10-CM | POA: Diagnosis not present

## 2015-06-30 DIAGNOSIS — Z4801 Encounter for change or removal of surgical wound dressing: Secondary | ICD-10-CM | POA: Diagnosis not present

## 2015-06-30 DIAGNOSIS — G2581 Restless legs syndrome: Secondary | ICD-10-CM | POA: Diagnosis not present

## 2015-06-30 DIAGNOSIS — Z471 Aftercare following joint replacement surgery: Secondary | ICD-10-CM | POA: Diagnosis not present

## 2015-06-30 DIAGNOSIS — Z79891 Long term (current) use of opiate analgesic: Secondary | ICD-10-CM | POA: Diagnosis not present

## 2015-06-30 DIAGNOSIS — Z7982 Long term (current) use of aspirin: Secondary | ICD-10-CM | POA: Diagnosis not present

## 2015-06-30 DIAGNOSIS — J45909 Unspecified asthma, uncomplicated: Secondary | ICD-10-CM | POA: Diagnosis not present

## 2015-06-30 DIAGNOSIS — Z87891 Personal history of nicotine dependence: Secondary | ICD-10-CM | POA: Diagnosis not present

## 2015-06-30 DIAGNOSIS — I10 Essential (primary) hypertension: Secondary | ICD-10-CM | POA: Diagnosis not present

## 2015-07-05 DIAGNOSIS — Z96642 Presence of left artificial hip joint: Secondary | ICD-10-CM | POA: Diagnosis not present

## 2015-07-29 DIAGNOSIS — J449 Chronic obstructive pulmonary disease, unspecified: Secondary | ICD-10-CM | POA: Diagnosis not present

## 2015-08-16 DIAGNOSIS — Z96642 Presence of left artificial hip joint: Secondary | ICD-10-CM | POA: Diagnosis not present

## 2015-08-28 DIAGNOSIS — J449 Chronic obstructive pulmonary disease, unspecified: Secondary | ICD-10-CM | POA: Diagnosis not present

## 2015-09-28 DIAGNOSIS — J449 Chronic obstructive pulmonary disease, unspecified: Secondary | ICD-10-CM | POA: Diagnosis not present

## 2015-10-12 DIAGNOSIS — Z79899 Other long term (current) drug therapy: Secondary | ICD-10-CM | POA: Diagnosis not present

## 2015-10-12 DIAGNOSIS — Z0000 Encounter for general adult medical examination without abnormal findings: Secondary | ICD-10-CM | POA: Diagnosis not present

## 2015-10-12 DIAGNOSIS — B351 Tinea unguium: Secondary | ICD-10-CM | POA: Diagnosis not present

## 2015-10-12 DIAGNOSIS — E785 Hyperlipidemia, unspecified: Secondary | ICD-10-CM | POA: Diagnosis not present

## 2015-10-12 DIAGNOSIS — I10 Essential (primary) hypertension: Secondary | ICD-10-CM | POA: Diagnosis not present

## 2015-10-15 DIAGNOSIS — Z1231 Encounter for screening mammogram for malignant neoplasm of breast: Secondary | ICD-10-CM | POA: Diagnosis not present

## 2015-10-21 DIAGNOSIS — B351 Tinea unguium: Secondary | ICD-10-CM | POA: Diagnosis not present

## 2015-10-21 DIAGNOSIS — M79674 Pain in right toe(s): Secondary | ICD-10-CM

## 2015-10-21 DIAGNOSIS — M79675 Pain in left toe(s): Secondary | ICD-10-CM | POA: Diagnosis not present

## 2015-10-26 DIAGNOSIS — S20219A Contusion of unspecified front wall of thorax, initial encounter: Secondary | ICD-10-CM | POA: Diagnosis not present

## 2015-10-27 DIAGNOSIS — S299XXA Unspecified injury of thorax, initial encounter: Secondary | ICD-10-CM | POA: Diagnosis not present

## 2015-10-27 DIAGNOSIS — R0781 Pleurodynia: Secondary | ICD-10-CM | POA: Diagnosis not present

## 2015-10-27 DIAGNOSIS — R938 Abnormal findings on diagnostic imaging of other specified body structures: Secondary | ICD-10-CM | POA: Diagnosis not present

## 2015-10-28 DIAGNOSIS — J449 Chronic obstructive pulmonary disease, unspecified: Secondary | ICD-10-CM | POA: Diagnosis not present

## 2015-11-16 DIAGNOSIS — Z96642 Presence of left artificial hip joint: Secondary | ICD-10-CM | POA: Diagnosis not present

## 2015-11-16 DIAGNOSIS — M549 Dorsalgia, unspecified: Secondary | ICD-10-CM | POA: Diagnosis not present

## 2015-11-20 ENCOUNTER — Other Ambulatory Visit: Payer: Self-pay | Admitting: Orthopaedic Surgery

## 2015-11-20 DIAGNOSIS — M545 Low back pain, unspecified: Secondary | ICD-10-CM

## 2015-11-22 DIAGNOSIS — M25651 Stiffness of right hip, not elsewhere classified: Secondary | ICD-10-CM | POA: Diagnosis not present

## 2015-11-22 DIAGNOSIS — M25551 Pain in right hip: Secondary | ICD-10-CM | POA: Diagnosis not present

## 2015-11-28 ENCOUNTER — Ambulatory Visit
Admission: RE | Admit: 2015-11-28 | Discharge: 2015-11-28 | Disposition: A | Discharge status: Home / Self Care | Payer: Medicaid Other | Source: Ambulatory Visit | Attending: Orthopaedic Surgery | Admitting: Orthopaedic Surgery

## 2015-11-28 DIAGNOSIS — M545 Low back pain, unspecified: Secondary | ICD-10-CM

## 2015-11-28 DIAGNOSIS — J449 Chronic obstructive pulmonary disease, unspecified: Secondary | ICD-10-CM | POA: Diagnosis not present

## 2015-11-28 DIAGNOSIS — M4806 Spinal stenosis, lumbar region: Secondary | ICD-10-CM | POA: Diagnosis not present

## 2015-12-07 DIAGNOSIS — M549 Dorsalgia, unspecified: Secondary | ICD-10-CM | POA: Diagnosis not present

## 2015-12-14 DIAGNOSIS — H25813 Combined forms of age-related cataract, bilateral: Secondary | ICD-10-CM | POA: Diagnosis not present

## 2015-12-20 DIAGNOSIS — H2511 Age-related nuclear cataract, right eye: Secondary | ICD-10-CM | POA: Diagnosis not present

## 2015-12-20 DIAGNOSIS — H578 Other specified disorders of eye and adnexa: Secondary | ICD-10-CM | POA: Diagnosis not present

## 2015-12-20 DIAGNOSIS — H353131 Nonexudative age-related macular degeneration, bilateral, early dry stage: Secondary | ICD-10-CM | POA: Diagnosis not present

## 2015-12-20 DIAGNOSIS — Z01818 Encounter for other preprocedural examination: Secondary | ICD-10-CM | POA: Diagnosis not present

## 2015-12-20 DIAGNOSIS — H25811 Combined forms of age-related cataract, right eye: Secondary | ICD-10-CM | POA: Diagnosis not present

## 2015-12-21 DIAGNOSIS — H25811 Combined forms of age-related cataract, right eye: Secondary | ICD-10-CM | POA: Diagnosis not present

## 2015-12-26 ENCOUNTER — Telehealth: Payer: Self-pay | Admitting: Neurology

## 2015-12-26 NOTE — Telephone Encounter (Signed)
-----   Message from Kathrynn Ducking, MD sent at 12/25/2014  4:04 PM EDT ----- Recheck carotid Doppler study, 50-69% stenosis of the right proximal to mid internal carotid artery.

## 2015-12-27 DIAGNOSIS — H2512 Age-related nuclear cataract, left eye: Secondary | ICD-10-CM | POA: Diagnosis not present

## 2015-12-27 DIAGNOSIS — H25812 Combined forms of age-related cataract, left eye: Secondary | ICD-10-CM | POA: Diagnosis not present

## 2015-12-27 NOTE — Telephone Encounter (Signed)
I have called several times to try to reach this patient to reschedule a carotid Doppler, both telephone numbers listed are not operational. This is for a follow-up carotid Doppler study for carotid stenosis.

## 2015-12-29 DIAGNOSIS — J449 Chronic obstructive pulmonary disease, unspecified: Secondary | ICD-10-CM | POA: Diagnosis not present

## 2016-01-28 DIAGNOSIS — J449 Chronic obstructive pulmonary disease, unspecified: Secondary | ICD-10-CM | POA: Diagnosis not present

## 2016-02-07 DIAGNOSIS — M25561 Pain in right knee: Secondary | ICD-10-CM | POA: Diagnosis not present

## 2016-02-07 DIAGNOSIS — M545 Low back pain: Secondary | ICD-10-CM | POA: Diagnosis not present

## 2016-02-07 DIAGNOSIS — M5489 Other dorsalgia: Secondary | ICD-10-CM | POA: Diagnosis not present

## 2016-02-10 DIAGNOSIS — M545 Low back pain: Secondary | ICD-10-CM | POA: Diagnosis not present

## 2016-02-10 DIAGNOSIS — M5489 Other dorsalgia: Secondary | ICD-10-CM | POA: Diagnosis not present

## 2016-02-10 DIAGNOSIS — M25561 Pain in right knee: Secondary | ICD-10-CM | POA: Diagnosis not present

## 2016-02-14 DIAGNOSIS — M545 Low back pain: Secondary | ICD-10-CM | POA: Diagnosis not present

## 2016-02-14 DIAGNOSIS — M5489 Other dorsalgia: Secondary | ICD-10-CM | POA: Diagnosis not present

## 2016-02-14 DIAGNOSIS — M25561 Pain in right knee: Secondary | ICD-10-CM | POA: Diagnosis not present

## 2016-02-17 DIAGNOSIS — M25561 Pain in right knee: Secondary | ICD-10-CM | POA: Diagnosis not present

## 2016-02-17 DIAGNOSIS — M5489 Other dorsalgia: Secondary | ICD-10-CM | POA: Diagnosis not present

## 2016-02-17 DIAGNOSIS — M545 Low back pain: Secondary | ICD-10-CM | POA: Diagnosis not present

## 2016-02-28 DIAGNOSIS — M25561 Pain in right knee: Secondary | ICD-10-CM | POA: Diagnosis not present

## 2016-02-28 DIAGNOSIS — M5489 Other dorsalgia: Secondary | ICD-10-CM | POA: Diagnosis not present

## 2016-02-28 DIAGNOSIS — J449 Chronic obstructive pulmonary disease, unspecified: Secondary | ICD-10-CM | POA: Diagnosis not present

## 2016-02-28 DIAGNOSIS — M545 Low back pain: Secondary | ICD-10-CM | POA: Diagnosis not present

## 2016-03-06 DIAGNOSIS — R0600 Dyspnea, unspecified: Secondary | ICD-10-CM | POA: Diagnosis not present

## 2016-03-06 DIAGNOSIS — R002 Palpitations: Secondary | ICD-10-CM | POA: Diagnosis not present

## 2016-03-10 DIAGNOSIS — Z9181 History of falling: Secondary | ICD-10-CM | POA: Diagnosis not present

## 2016-03-10 DIAGNOSIS — J00 Acute nasopharyngitis [common cold]: Secondary | ICD-10-CM | POA: Diagnosis not present

## 2016-03-10 DIAGNOSIS — R002 Palpitations: Secondary | ICD-10-CM | POA: Diagnosis not present

## 2016-03-29 DIAGNOSIS — J449 Chronic obstructive pulmonary disease, unspecified: Secondary | ICD-10-CM | POA: Diagnosis not present

## 2016-04-05 DIAGNOSIS — Z87891 Personal history of nicotine dependence: Secondary | ICD-10-CM | POA: Diagnosis not present

## 2016-04-05 DIAGNOSIS — I10 Essential (primary) hypertension: Secondary | ICD-10-CM | POA: Diagnosis not present

## 2016-04-05 DIAGNOSIS — Z01810 Encounter for preprocedural cardiovascular examination: Secondary | ICD-10-CM | POA: Diagnosis not present

## 2016-04-05 DIAGNOSIS — E785 Hyperlipidemia, unspecified: Secondary | ICD-10-CM | POA: Diagnosis not present

## 2016-04-05 DIAGNOSIS — R002 Palpitations: Secondary | ICD-10-CM | POA: Diagnosis not present

## 2016-04-28 DIAGNOSIS — B351 Tinea unguium: Secondary | ICD-10-CM | POA: Diagnosis not present

## 2016-04-28 DIAGNOSIS — M79675 Pain in left toe(s): Secondary | ICD-10-CM | POA: Diagnosis not present

## 2016-04-28 DIAGNOSIS — M79674 Pain in right toe(s): Secondary | ICD-10-CM | POA: Diagnosis not present

## 2016-04-29 DIAGNOSIS — J449 Chronic obstructive pulmonary disease, unspecified: Secondary | ICD-10-CM | POA: Diagnosis not present

## 2016-05-09 DIAGNOSIS — M25561 Pain in right knee: Secondary | ICD-10-CM | POA: Diagnosis not present

## 2016-05-09 DIAGNOSIS — M545 Low back pain: Secondary | ICD-10-CM | POA: Diagnosis not present

## 2016-05-09 DIAGNOSIS — M5489 Other dorsalgia: Secondary | ICD-10-CM | POA: Diagnosis not present

## 2016-05-12 DIAGNOSIS — M5489 Other dorsalgia: Secondary | ICD-10-CM | POA: Diagnosis not present

## 2016-05-12 DIAGNOSIS — M545 Low back pain: Secondary | ICD-10-CM | POA: Diagnosis not present

## 2016-05-12 DIAGNOSIS — M25561 Pain in right knee: Secondary | ICD-10-CM | POA: Diagnosis not present

## 2016-05-16 DIAGNOSIS — M25561 Pain in right knee: Secondary | ICD-10-CM | POA: Diagnosis not present

## 2016-05-16 DIAGNOSIS — M545 Low back pain: Secondary | ICD-10-CM | POA: Diagnosis not present

## 2016-05-16 DIAGNOSIS — M5489 Other dorsalgia: Secondary | ICD-10-CM | POA: Diagnosis not present

## 2016-05-19 DIAGNOSIS — M5489 Other dorsalgia: Secondary | ICD-10-CM | POA: Diagnosis not present

## 2016-05-19 DIAGNOSIS — M25561 Pain in right knee: Secondary | ICD-10-CM | POA: Diagnosis not present

## 2016-05-19 DIAGNOSIS — M545 Low back pain: Secondary | ICD-10-CM | POA: Diagnosis not present

## 2016-05-22 DIAGNOSIS — M545 Low back pain: Secondary | ICD-10-CM | POA: Diagnosis not present

## 2016-05-22 DIAGNOSIS — M25561 Pain in right knee: Secondary | ICD-10-CM | POA: Diagnosis not present

## 2016-05-22 DIAGNOSIS — M5489 Other dorsalgia: Secondary | ICD-10-CM | POA: Diagnosis not present

## 2016-05-26 DIAGNOSIS — M25561 Pain in right knee: Secondary | ICD-10-CM | POA: Diagnosis not present

## 2016-05-26 DIAGNOSIS — M5489 Other dorsalgia: Secondary | ICD-10-CM | POA: Diagnosis not present

## 2016-05-26 DIAGNOSIS — M545 Low back pain: Secondary | ICD-10-CM | POA: Diagnosis not present

## 2016-05-30 DIAGNOSIS — M5489 Other dorsalgia: Secondary | ICD-10-CM | POA: Diagnosis not present

## 2016-05-30 DIAGNOSIS — M25561 Pain in right knee: Secondary | ICD-10-CM | POA: Diagnosis not present

## 2016-05-30 DIAGNOSIS — M545 Low back pain: Secondary | ICD-10-CM | POA: Diagnosis not present

## 2016-05-30 DIAGNOSIS — J449 Chronic obstructive pulmonary disease, unspecified: Secondary | ICD-10-CM | POA: Diagnosis not present

## 2016-06-27 DIAGNOSIS — J449 Chronic obstructive pulmonary disease, unspecified: Secondary | ICD-10-CM | POA: Diagnosis not present

## 2016-07-28 DIAGNOSIS — J449 Chronic obstructive pulmonary disease, unspecified: Secondary | ICD-10-CM | POA: Diagnosis not present

## 2016-08-27 DIAGNOSIS — J449 Chronic obstructive pulmonary disease, unspecified: Secondary | ICD-10-CM | POA: Diagnosis not present

## 2016-09-05 DIAGNOSIS — I10 Essential (primary) hypertension: Secondary | ICD-10-CM | POA: Diagnosis not present

## 2016-09-05 DIAGNOSIS — G8929 Other chronic pain: Secondary | ICD-10-CM | POA: Diagnosis not present

## 2016-09-05 DIAGNOSIS — Z0100 Encounter for examination of eyes and vision without abnormal findings: Secondary | ICD-10-CM | POA: Diagnosis not present

## 2016-09-05 DIAGNOSIS — Z0110 Encounter for examination of ears and hearing without abnormal findings: Secondary | ICD-10-CM | POA: Diagnosis not present

## 2016-09-05 DIAGNOSIS — M25551 Pain in right hip: Secondary | ICD-10-CM | POA: Diagnosis not present

## 2016-09-05 DIAGNOSIS — E785 Hyperlipidemia, unspecified: Secondary | ICD-10-CM | POA: Diagnosis not present

## 2016-09-05 DIAGNOSIS — R001 Bradycardia, unspecified: Secondary | ICD-10-CM | POA: Diagnosis not present

## 2016-09-05 DIAGNOSIS — Z0001 Encounter for general adult medical examination with abnormal findings: Secondary | ICD-10-CM | POA: Diagnosis not present

## 2016-09-27 DIAGNOSIS — J449 Chronic obstructive pulmonary disease, unspecified: Secondary | ICD-10-CM | POA: Diagnosis not present

## 2016-10-16 DIAGNOSIS — M25551 Pain in right hip: Secondary | ICD-10-CM | POA: Diagnosis not present

## 2016-10-16 DIAGNOSIS — G8929 Other chronic pain: Secondary | ICD-10-CM | POA: Diagnosis not present

## 2016-10-16 DIAGNOSIS — M1611 Unilateral primary osteoarthritis, right hip: Secondary | ICD-10-CM | POA: Diagnosis not present

## 2016-10-23 DIAGNOSIS — Z01810 Encounter for preprocedural cardiovascular examination: Secondary | ICD-10-CM | POA: Diagnosis not present

## 2016-10-23 DIAGNOSIS — M1611 Unilateral primary osteoarthritis, right hip: Secondary | ICD-10-CM | POA: Diagnosis not present

## 2016-10-23 DIAGNOSIS — Z9189 Other specified personal risk factors, not elsewhere classified: Secondary | ICD-10-CM | POA: Diagnosis not present

## 2016-10-25 DIAGNOSIS — I517 Cardiomegaly: Secondary | ICD-10-CM | POA: Diagnosis not present

## 2016-10-25 DIAGNOSIS — I445 Left posterior fascicular block: Secondary | ICD-10-CM | POA: Diagnosis not present

## 2016-10-27 DIAGNOSIS — J449 Chronic obstructive pulmonary disease, unspecified: Secondary | ICD-10-CM | POA: Diagnosis not present

## 2016-10-31 DIAGNOSIS — M79675 Pain in left toe(s): Secondary | ICD-10-CM | POA: Diagnosis not present

## 2016-10-31 DIAGNOSIS — B351 Tinea unguium: Secondary | ICD-10-CM | POA: Diagnosis not present

## 2016-10-31 DIAGNOSIS — M79674 Pain in right toe(s): Secondary | ICD-10-CM | POA: Diagnosis not present

## 2016-11-09 DIAGNOSIS — J449 Chronic obstructive pulmonary disease, unspecified: Secondary | ICD-10-CM | POA: Diagnosis not present

## 2016-11-09 DIAGNOSIS — I498 Other specified cardiac arrhythmias: Secondary | ICD-10-CM | POA: Diagnosis not present

## 2016-11-09 DIAGNOSIS — M1611 Unilateral primary osteoarthritis, right hip: Secondary | ICD-10-CM | POA: Diagnosis not present

## 2016-11-09 DIAGNOSIS — Z471 Aftercare following joint replacement surgery: Secondary | ICD-10-CM | POA: Diagnosis not present

## 2016-11-09 DIAGNOSIS — J302 Other seasonal allergic rhinitis: Secondary | ICD-10-CM | POA: Diagnosis not present

## 2016-11-09 DIAGNOSIS — Z87891 Personal history of nicotine dependence: Secondary | ICD-10-CM | POA: Diagnosis not present

## 2016-11-09 DIAGNOSIS — G4733 Obstructive sleep apnea (adult) (pediatric): Secondary | ICD-10-CM | POA: Diagnosis not present

## 2016-11-09 DIAGNOSIS — G8918 Other acute postprocedural pain: Secondary | ICD-10-CM | POA: Diagnosis not present

## 2016-11-09 DIAGNOSIS — I10 Essential (primary) hypertension: Secondary | ICD-10-CM | POA: Diagnosis not present

## 2016-11-09 DIAGNOSIS — E785 Hyperlipidemia, unspecified: Secondary | ICD-10-CM | POA: Diagnosis not present

## 2016-11-09 DIAGNOSIS — Z96642 Presence of left artificial hip joint: Secondary | ICD-10-CM | POA: Diagnosis not present

## 2016-11-09 DIAGNOSIS — Z9981 Dependence on supplemental oxygen: Secondary | ICD-10-CM | POA: Diagnosis not present

## 2016-11-16 DIAGNOSIS — M25551 Pain in right hip: Secondary | ICD-10-CM | POA: Diagnosis not present

## 2016-11-16 DIAGNOSIS — R269 Unspecified abnormalities of gait and mobility: Secondary | ICD-10-CM | POA: Diagnosis not present

## 2016-11-16 DIAGNOSIS — M25651 Stiffness of right hip, not elsewhere classified: Secondary | ICD-10-CM | POA: Diagnosis not present

## 2016-11-21 DIAGNOSIS — M25651 Stiffness of right hip, not elsewhere classified: Secondary | ICD-10-CM | POA: Diagnosis not present

## 2016-11-21 DIAGNOSIS — Z96641 Presence of right artificial hip joint: Secondary | ICD-10-CM | POA: Insufficient documentation

## 2016-11-21 DIAGNOSIS — R269 Unspecified abnormalities of gait and mobility: Secondary | ICD-10-CM | POA: Diagnosis not present

## 2016-11-21 DIAGNOSIS — M25551 Pain in right hip: Secondary | ICD-10-CM | POA: Diagnosis not present

## 2016-11-24 DIAGNOSIS — Z96641 Presence of right artificial hip joint: Secondary | ICD-10-CM | POA: Diagnosis not present

## 2016-11-24 DIAGNOSIS — T8189XA Other complications of procedures, not elsewhere classified, initial encounter: Secondary | ICD-10-CM | POA: Diagnosis not present

## 2016-11-24 DIAGNOSIS — R269 Unspecified abnormalities of gait and mobility: Secondary | ICD-10-CM | POA: Diagnosis not present

## 2016-11-24 DIAGNOSIS — M25551 Pain in right hip: Secondary | ICD-10-CM | POA: Diagnosis not present

## 2016-11-24 DIAGNOSIS — Z4802 Encounter for removal of sutures: Secondary | ICD-10-CM | POA: Diagnosis not present

## 2016-11-24 DIAGNOSIS — M25651 Stiffness of right hip, not elsewhere classified: Secondary | ICD-10-CM | POA: Diagnosis not present

## 2016-11-24 DIAGNOSIS — Z471 Aftercare following joint replacement surgery: Secondary | ICD-10-CM | POA: Diagnosis not present

## 2016-11-27 DIAGNOSIS — M25551 Pain in right hip: Secondary | ICD-10-CM | POA: Diagnosis not present

## 2016-11-27 DIAGNOSIS — M25651 Stiffness of right hip, not elsewhere classified: Secondary | ICD-10-CM | POA: Diagnosis not present

## 2016-11-27 DIAGNOSIS — R269 Unspecified abnormalities of gait and mobility: Secondary | ICD-10-CM | POA: Diagnosis not present

## 2016-11-27 DIAGNOSIS — J449 Chronic obstructive pulmonary disease, unspecified: Secondary | ICD-10-CM | POA: Diagnosis not present

## 2016-11-30 DIAGNOSIS — M25651 Stiffness of right hip, not elsewhere classified: Secondary | ICD-10-CM | POA: Diagnosis not present

## 2016-11-30 DIAGNOSIS — M25551 Pain in right hip: Secondary | ICD-10-CM | POA: Diagnosis not present

## 2016-11-30 DIAGNOSIS — R269 Unspecified abnormalities of gait and mobility: Secondary | ICD-10-CM | POA: Diagnosis not present

## 2016-12-01 DIAGNOSIS — Z9889 Other specified postprocedural states: Secondary | ICD-10-CM | POA: Diagnosis not present

## 2016-12-01 DIAGNOSIS — Z87891 Personal history of nicotine dependence: Secondary | ICD-10-CM | POA: Diagnosis not present

## 2016-12-01 DIAGNOSIS — Z96641 Presence of right artificial hip joint: Secondary | ICD-10-CM | POA: Diagnosis not present

## 2016-12-05 DIAGNOSIS — M25551 Pain in right hip: Secondary | ICD-10-CM | POA: Diagnosis not present

## 2016-12-05 DIAGNOSIS — M25651 Stiffness of right hip, not elsewhere classified: Secondary | ICD-10-CM | POA: Diagnosis not present

## 2016-12-05 DIAGNOSIS — R269 Unspecified abnormalities of gait and mobility: Secondary | ICD-10-CM | POA: Diagnosis not present

## 2016-12-06 IMAGING — MR MR LUMBAR SPINE W/O CM
4 of 5 series · 19 of 48 positions shown · non-contrast
Comparison: None.

CLINICAL DATA: Lumbar spine pain

EXAM:
MRI LUMBAR SPINE WITHOUT CONTRAST
TECHNIQUE: Multiplanar, multisequence MR imaging of the lumbar spine was
performed. No intravenous contrast was administered.

[Series 6: T2 · sagittal · 4.0mm · 0.70mm/px · 6 of 15 slices shown (1 of 2)]
[im 1/15]
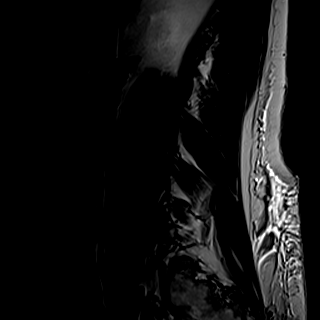
[im 3/15]
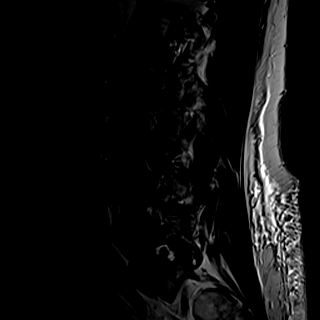
[im 6/15]
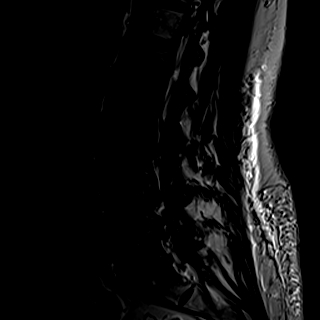
[im 9/15]
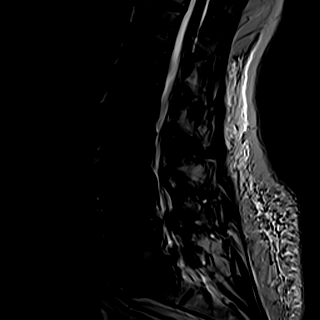
[im 12/15]
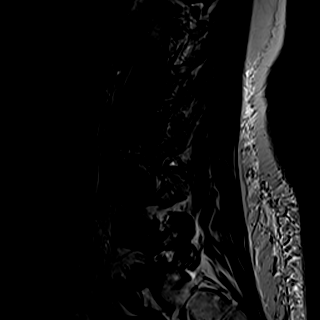
[im 15/15]
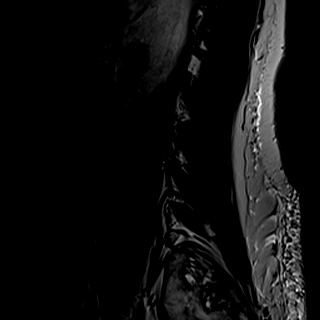

[Series 7: T1 · sagittal · 4.0mm · 0.70mm/px · 3 of 15 slices shown (1 of 2)]
[im 3/15]
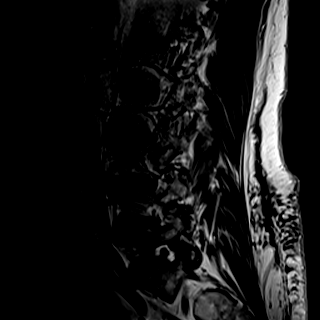
[im 8/15]
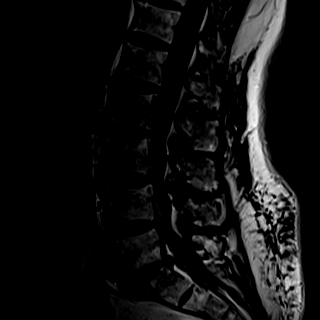
[im 12/15]
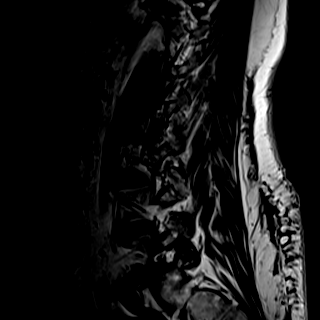

[Series 11: T2 · axial · 4.0mm · 0.28mm/px · z∈[-86,+65]mm · 7 of 32 slices shown (2 of 2)]
[im 1/32]
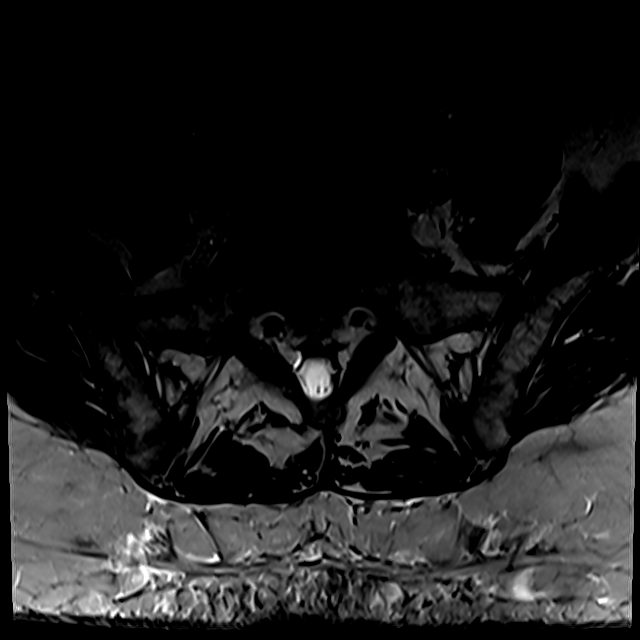
[im 5/32]
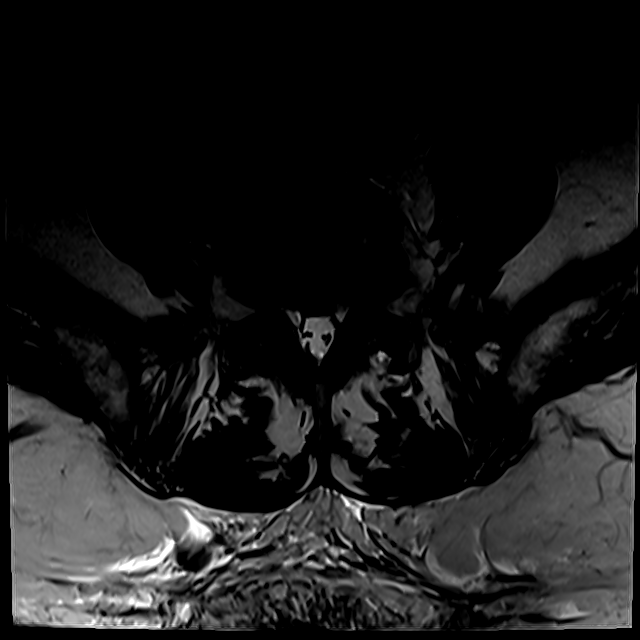
[im 10/32]
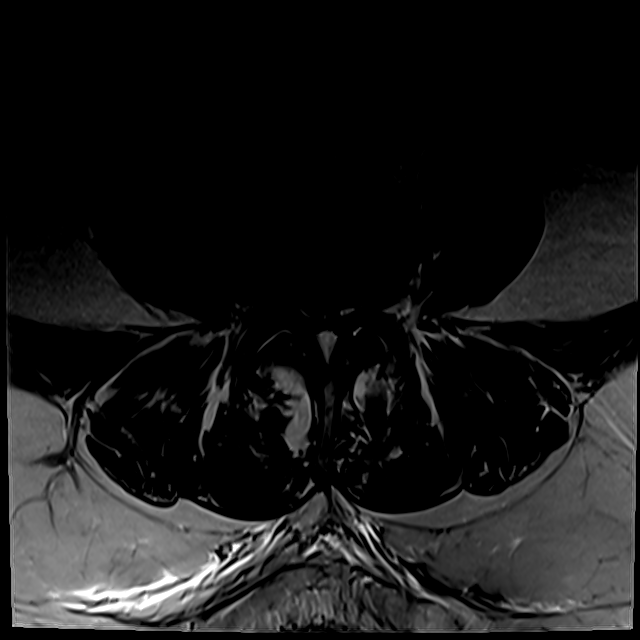
[im 15/32]
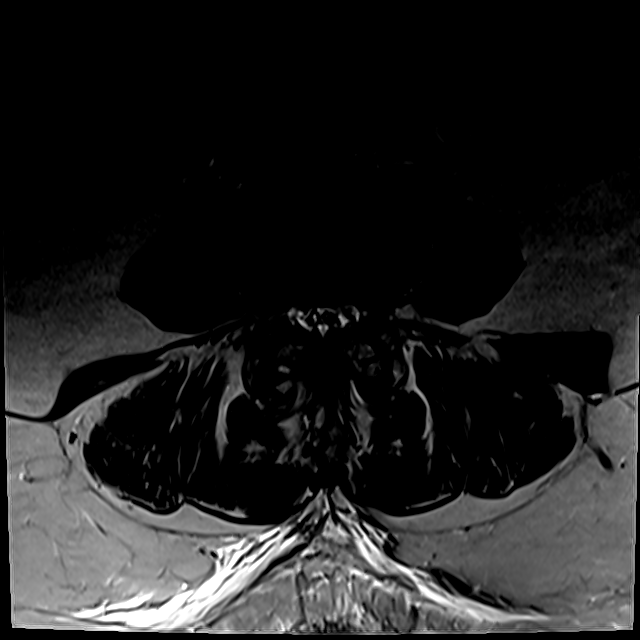
[im 17/32]
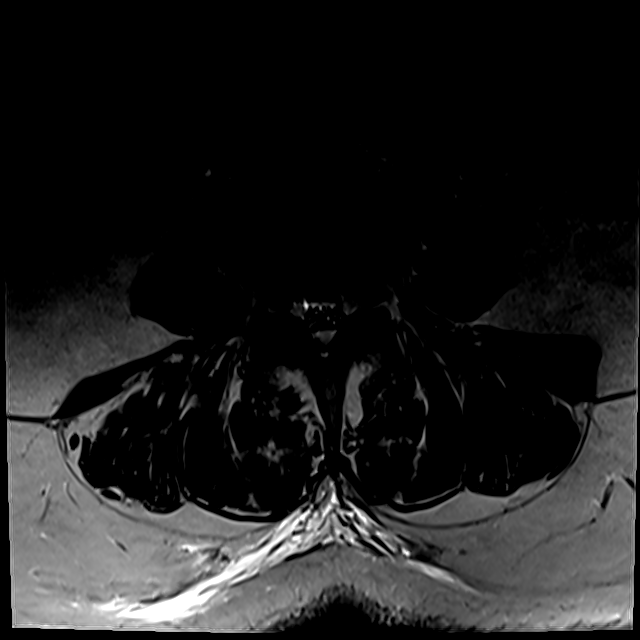
[im 22/32]
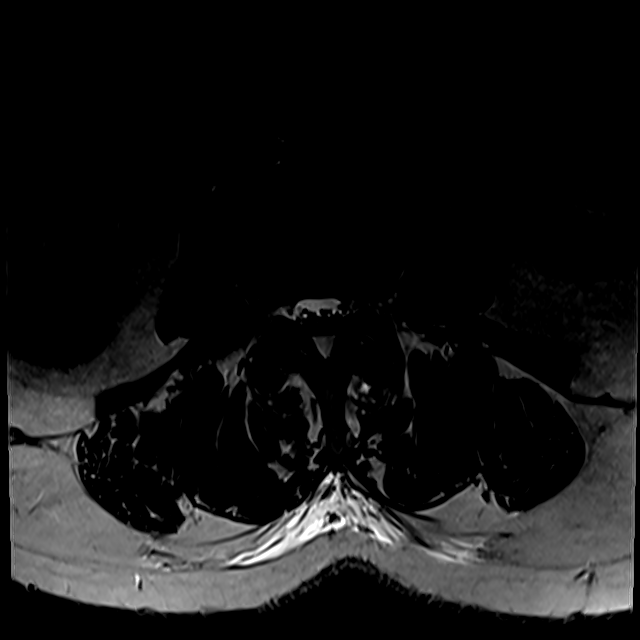
[im 27/32]
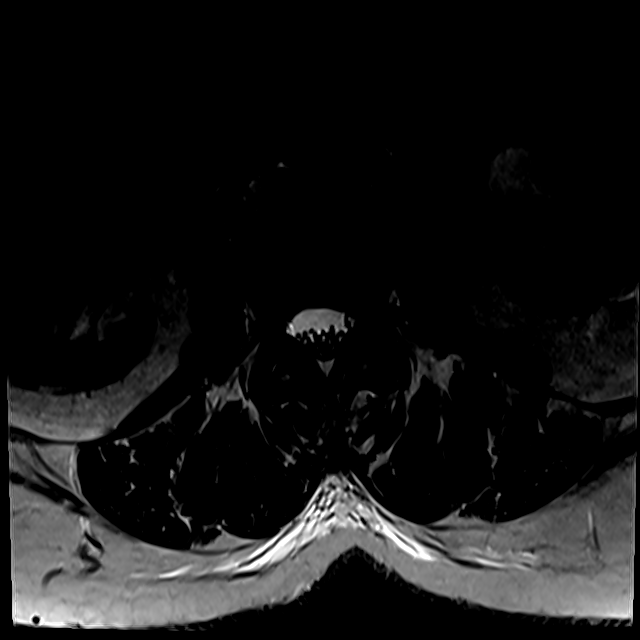

[Series 14: T1 · axial · 4.0mm · 0.28mm/px · z∈[-66,+65]mm · 3 of 32 slices shown (2 of 2)]
[im 5/32]
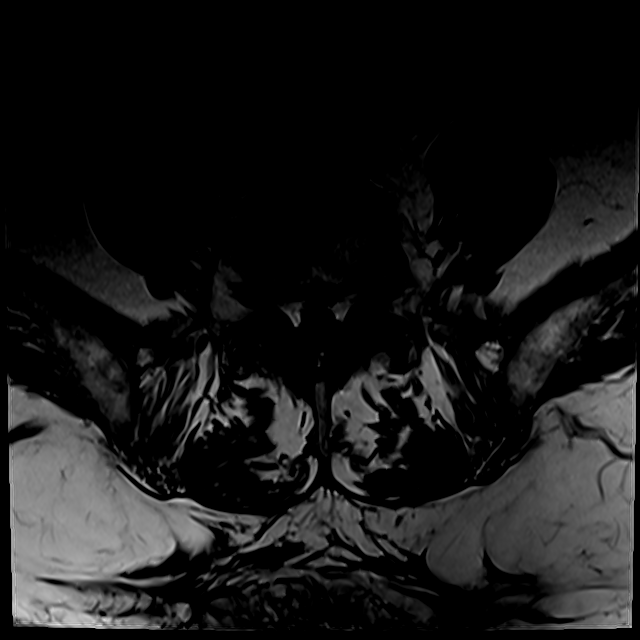
[im 17/32]
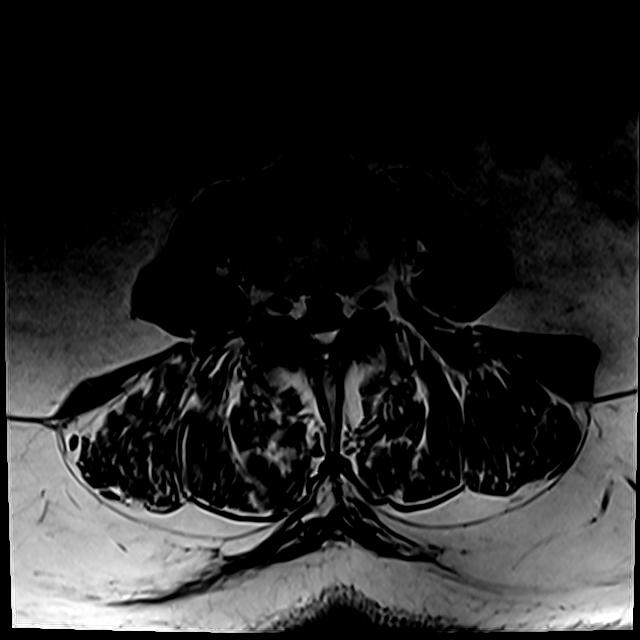
[im 27/32]
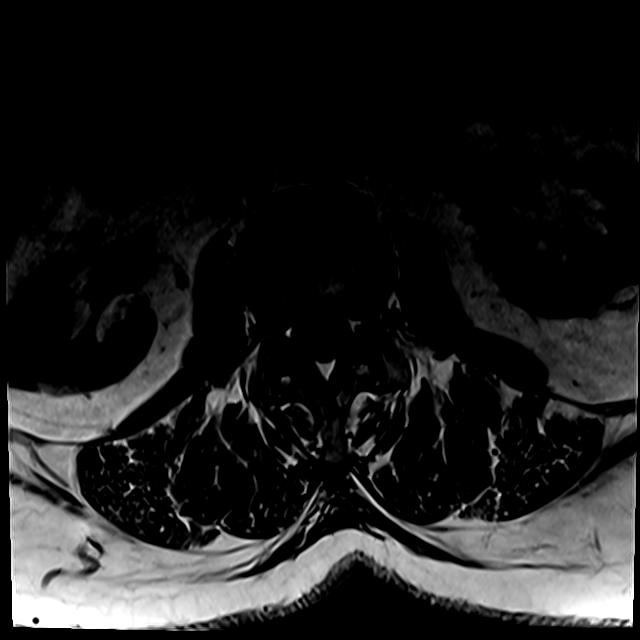

[19 of 48 positions shown; findings below may reference images not displayed]

FINDINGS: Segmentation:  Normal segmentation

Alignment: Mild anterior listhesis L3-4. Remaining alignment normal

Vertebrae: Negative for fracture or mass. Normal bone marrow.
Hemangioma L5 vertebral body on the right.

Conus medullaris: Extends to the L1-2 level and appears normal.

Paraspinal and other soft tissues: Paraspinous muscles are symmetric
without focal abnormality. No retroperitoneal mass or adenopathy.

Disc levels:

L1-2:  Negative

L2-3: Mild disc bulging and mild facet degeneration without
significant spinal stenosis.

L3-4: 3 mm anterior listhesis. Moderate to advanced facet
hypertrophy. Moderate to severe spinal stenosis. Neural foramina
adequately patent

L4-5: Moderately large right paracentral disc protrusion compressing
the thecal sac. Moderate facet hypertrophy bilaterally. Moderate to
severe spinal stenosis. Subarticular stenosis on the right with
possible impingement of the right L5 nerve root. Neural foramina
patent.

L5-S1: Disc degeneration and disc bulging. Bilateral facet
hypertrophy. No significant neural impingement.
IMPRESSION: 3 mm anterior listhesis L3-4 with moderate to severe spinal
stenosis.

Right paracentral disc protrusion L4-5 with moderate to severe
spinal stenosis. Possible impingement right L5 nerve root.

## 2016-12-08 DIAGNOSIS — R269 Unspecified abnormalities of gait and mobility: Secondary | ICD-10-CM | POA: Diagnosis not present

## 2016-12-08 DIAGNOSIS — M25551 Pain in right hip: Secondary | ICD-10-CM | POA: Diagnosis not present

## 2016-12-08 DIAGNOSIS — M25651 Stiffness of right hip, not elsewhere classified: Secondary | ICD-10-CM | POA: Diagnosis not present

## 2016-12-11 DIAGNOSIS — R269 Unspecified abnormalities of gait and mobility: Secondary | ICD-10-CM | POA: Diagnosis not present

## 2016-12-11 DIAGNOSIS — M25551 Pain in right hip: Secondary | ICD-10-CM | POA: Diagnosis not present

## 2016-12-11 DIAGNOSIS — M25651 Stiffness of right hip, not elsewhere classified: Secondary | ICD-10-CM | POA: Diagnosis not present

## 2016-12-18 DIAGNOSIS — M25651 Stiffness of right hip, not elsewhere classified: Secondary | ICD-10-CM | POA: Diagnosis not present

## 2016-12-18 DIAGNOSIS — M25551 Pain in right hip: Secondary | ICD-10-CM | POA: Diagnosis not present

## 2016-12-18 DIAGNOSIS — R269 Unspecified abnormalities of gait and mobility: Secondary | ICD-10-CM | POA: Diagnosis not present

## 2016-12-20 DIAGNOSIS — J449 Chronic obstructive pulmonary disease, unspecified: Secondary | ICD-10-CM | POA: Diagnosis not present

## 2016-12-20 DIAGNOSIS — Z888 Allergy status to other drugs, medicaments and biological substances status: Secondary | ICD-10-CM | POA: Diagnosis not present

## 2016-12-20 DIAGNOSIS — Z471 Aftercare following joint replacement surgery: Secondary | ICD-10-CM | POA: Diagnosis not present

## 2016-12-20 DIAGNOSIS — Z87891 Personal history of nicotine dependence: Secondary | ICD-10-CM | POA: Diagnosis not present

## 2016-12-20 DIAGNOSIS — I10 Essential (primary) hypertension: Secondary | ICD-10-CM | POA: Diagnosis not present

## 2016-12-20 DIAGNOSIS — Z96641 Presence of right artificial hip joint: Secondary | ICD-10-CM | POA: Diagnosis not present

## 2016-12-20 DIAGNOSIS — E785 Hyperlipidemia, unspecified: Secondary | ICD-10-CM | POA: Diagnosis not present

## 2016-12-20 DIAGNOSIS — Z96643 Presence of artificial hip joint, bilateral: Secondary | ICD-10-CM | POA: Diagnosis not present

## 2016-12-20 DIAGNOSIS — M1611 Unilateral primary osteoarthritis, right hip: Secondary | ICD-10-CM | POA: Diagnosis not present

## 2016-12-21 DIAGNOSIS — M25651 Stiffness of right hip, not elsewhere classified: Secondary | ICD-10-CM | POA: Diagnosis not present

## 2016-12-21 DIAGNOSIS — R269 Unspecified abnormalities of gait and mobility: Secondary | ICD-10-CM | POA: Diagnosis not present

## 2016-12-21 DIAGNOSIS — M25551 Pain in right hip: Secondary | ICD-10-CM | POA: Diagnosis not present

## 2016-12-25 DIAGNOSIS — R269 Unspecified abnormalities of gait and mobility: Secondary | ICD-10-CM | POA: Diagnosis not present

## 2016-12-25 DIAGNOSIS — M25551 Pain in right hip: Secondary | ICD-10-CM | POA: Diagnosis not present

## 2016-12-25 DIAGNOSIS — M25651 Stiffness of right hip, not elsewhere classified: Secondary | ICD-10-CM | POA: Diagnosis not present

## 2016-12-26 DIAGNOSIS — Z9189 Other specified personal risk factors, not elsewhere classified: Secondary | ICD-10-CM | POA: Diagnosis not present

## 2016-12-26 DIAGNOSIS — J410 Simple chronic bronchitis: Secondary | ICD-10-CM | POA: Diagnosis not present

## 2016-12-26 DIAGNOSIS — I10 Essential (primary) hypertension: Secondary | ICD-10-CM | POA: Diagnosis not present

## 2016-12-26 DIAGNOSIS — G4734 Idiopathic sleep related nonobstructive alveolar hypoventilation: Secondary | ICD-10-CM | POA: Diagnosis not present

## 2016-12-28 DIAGNOSIS — J449 Chronic obstructive pulmonary disease, unspecified: Secondary | ICD-10-CM | POA: Diagnosis not present

## 2016-12-28 DIAGNOSIS — R269 Unspecified abnormalities of gait and mobility: Secondary | ICD-10-CM | POA: Diagnosis not present

## 2016-12-28 DIAGNOSIS — M25651 Stiffness of right hip, not elsewhere classified: Secondary | ICD-10-CM | POA: Diagnosis not present

## 2016-12-28 DIAGNOSIS — M25551 Pain in right hip: Secondary | ICD-10-CM | POA: Diagnosis not present

## 2017-02-05 ENCOUNTER — Telehealth: Payer: Self-pay

## 2017-02-05 NOTE — Telephone Encounter (Signed)
Medication refill request from [facility] came on fax to refill Lisinopril. Pt has not seen pt in over a year and will need to schedule appt if pt is planning on continuing care with Dr. Geraldo Pitter.

## 2017-03-06 DIAGNOSIS — Z1231 Encounter for screening mammogram for malignant neoplasm of breast: Secondary | ICD-10-CM | POA: Insufficient documentation

## 2017-03-06 DIAGNOSIS — Z13820 Encounter for screening for osteoporosis: Secondary | ICD-10-CM | POA: Insufficient documentation

## 2017-06-06 ENCOUNTER — Other Ambulatory Visit: Payer: Self-pay

## 2017-06-11 ENCOUNTER — Other Ambulatory Visit: Payer: Self-pay

## 2017-06-14 ENCOUNTER — Ambulatory Visit (INDEPENDENT_AMBULATORY_CARE_PROVIDER_SITE_OTHER): Payer: Medicare Other | Admitting: Cardiology

## 2017-06-14 ENCOUNTER — Encounter: Payer: Self-pay | Admitting: Cardiology

## 2017-06-14 VITALS — BP 118/72 | HR 77 | Ht 66.0 in | Wt 191.1 lb

## 2017-06-14 DIAGNOSIS — E785 Hyperlipidemia, unspecified: Secondary | ICD-10-CM | POA: Diagnosis not present

## 2017-06-14 DIAGNOSIS — Z87891 Personal history of nicotine dependence: Secondary | ICD-10-CM | POA: Diagnosis not present

## 2017-06-14 DIAGNOSIS — I10 Essential (primary) hypertension: Secondary | ICD-10-CM

## 2017-06-14 DIAGNOSIS — E7800 Pure hypercholesterolemia, unspecified: Secondary | ICD-10-CM

## 2017-06-14 NOTE — Progress Notes (Signed)
Cardiology Office Note:    Date:  06/14/2017   ID:  Kristin Petty, DOB 1941/01/19, MRN [id number]  PCP:  Rubie Maid, MD  Cardiologist:  Jenean Lindau, MD   Referring MD: Ronita Hipps, MD    ASSESSMENT:    1. Essential hypertension   2. Dyslipidemia   3. Ex-moderate cigarette smoker (10-19 per day)   4. Pure hypercholesterolemia    PLAN:    In order of problems listed above:  1. Her blood pressure stable and diet was discussed for dyslipidemia and essential hypertension.  She was advised to be more active and more ambulatory as her orthopedic condition permits.  Her lipids and other blood work are followed by her primary care physician.  She has never gone back to smoking fortunately.Patient will be seen in follow-up appointment in 9 months or earlier if the patient has any concerns    Medication Adjustments/Labs and Tests Ordered: Current medicines are reviewed at length with the patient today.  Concerns regarding medicines are outlined above.  Orders Placed This Encounter  Procedures  . EKG 12-Lead   No orders of the defined types were placed in this encounter.    Chief Complaint  Patient presents with  . Follow-up     History of Present Illness:    Kristin Petty is a 77 y.o. female.  Patient has history of essential hypertension and dyslipidemia.  She is an ex-smoker.  She denies any problems at this time and takes care of activities of daily living.  No chest pain orthopnea or PND.  She has had bilateral knee and hip replacement and tells me that she is recovering from the surgeries.  This patient has been under my care in my previous practice.  He is here now to transfer his care and be established with my current practice.  At the time of my evaluation, the patient is alert awake oriented and in no distress.  Past Medical History:  Diagnosis Date  . Arthritis   . Chronic renal insufficiency   . COPD (chronic obstructive pulmonary disease) ([place])   .  Disturbance of skin sensation 11/04/2013  . Dyslipidemia   . Esophageal reflux   . H/O Bell's palsy    right  . Hypertension     Past Surgical History:  Procedure Laterality Date  . benign breast parenchyma    . biopsy left breast    . CESAREAN SECTION    . CHOLECYSTECTOMY    . TOTAL KNEE ARTHROPLASTY    . TUBAL LIGATION      Current Medications: Current Meds  Medication Sig  . albuterol (PROAIR HFA) 108 (90 BASE) MCG/ACT inhaler Inhale 2 puffs into the lungs every 4 (four) hours as needed for wheezing or shortness of breath.  . Ascorbic Acid (VITAMIN C) 100 MG tablet Take 100 mg by mouth daily.  Marland Kitchen aspirin 325 MG tablet Take 81 mg by mouth daily.   . Calcium Carb-Cholecalciferol (CALCIUM 600 + D PO) Take 1 capsule by mouth 2 (two) times daily. 600 mg calcium + 400 vit d  . Fluticasone-Salmeterol (ADVAIR) 500-50 MCG/DOSE AEPB Inhale 1 puff into the lungs 2 (two) times daily.  . furosemide (LASIX) 20 MG tablet Take 20 mg by mouth daily. 10 mg daily  . gabapentin (NEURONTIN) 300 MG capsule TAKE ONE CAPSULE BY MOUTH AT BEDTIME  . ipratropium-albuterol (DUONEB) 0.5-2.5 (3) MG/3ML SOLN Take 3 mLs by nebulization every 4 (four) hours as needed (q 2-4 hrs prn).  Marland Kitchen  lisinopril (PRINIVIL,ZESTRIL) 10 MG tablet TAKE 1 TABLET BY MOUTH EVERY DAY  . metoprolol succinate (TOPROL-XL) 50 MG 24 hr tablet Take 50 mg by mouth.  . montelukast (SINGULAIR) 10 MG tablet Take 10 mg by mouth at bedtime.  . Omega-3 Fatty Acids (FISH OIL) 1000 MG CAPS Take 1 capsule by mouth daily.  . pravastatin (PRAVACHOL) 40 MG tablet Take 40 mg by mouth daily.  Marland Kitchen tiotropium (SPIRIVA) 18 MCG inhalation capsule Place 18 mcg into inhaler and inhale daily.     Allergies:   Antihistamines, chlorpheniramine-type   Social History   Socioeconomic History  . Marital status: Divorced    Spouse name: None  . Number of children: 6  . Years of education: hs  . Highest education level: None  Social Needs  . Financial resource  strain: None  . Food insecurity - worry: None  . Food insecurity - inability: None  . Transportation needs - medical: None  . Transportation needs - non-medical: None  Occupational History  . Occupation: Retired  Tobacco Use  . Smoking status: Former Smoker    Types: Cigarettes  . Smokeless tobacco: Never Used  . Tobacco comment: quit 02/2009  Substance and Sexual Activity  . Alcohol use: No  . Drug use: No  . Sexual activity: None  Other Topics Concern  . None  Social History Narrative  . None     Family History: The patient's family history includes Alcoholism in her father and mother; Heart attack in her father.  ROS:   Please see the history of present illness.    All other systems reviewed and are negative.  EKGs/Labs/Other Studies Reviewed:    The following studies were reviewed today: Patient's findings are stable today and I reassured her about this.  He done today reveals sinus rhythm, right bundle branch block and left anterior fascicular block.   Recent Labs: No results found for requested labs within last 8760 hours.  Recent Lipid Panel No results found for: CHOL, TRIG, HDL, CHOLHDL, VLDL, LDLCALC, LDLDIRECT  Physical Exam:    VS:  BP 118/72 (BP Location: Right Arm, Patient Position: Sitting, Cuff Size: Normal)   Pulse 77   Ht 5\' 6"  (1.676 m)   Wt 191 lb 1.9 oz (86.7 kg)   SpO2 98%   BMI 30.85 kg/m     Wt Readings from Last 3 Encounters:  06/14/17 191 lb 1.9 oz (86.7 kg)  11/04/13 191 lb (86.6 kg)     GEN: Patient is in no acute distress HEENT: Normal NECK: No JVD; No carotid bruits LYMPHATICS: No lymphadenopathy CARDIAC: Hear sounds regular, 2/6 systolic murmur at the apex. RESPIRATORY:  Clear to auscultation without rales, wheezing or rhonchi  ABDOMEN: Soft, non-tender, non-distended MUSCULOSKELETAL:  No edema; No deformity  SKIN: Warm and dry NEUROLOGIC:  Alert and oriented x 3 PSYCHIATRIC:  Normal affect   Signed, Jenean Lindau,  MD  06/14/2017 4:12 PM    [facility]

## 2017-06-14 NOTE — Patient Instructions (Signed)
Medication Instructions:  Your physician recommends that you continue on your current medications as directed. Please refer to the Current Medication list given to you today.  Labwork: None  Testing/Procedures: None  Follow-Up: Your physician recommends that you schedule a follow-up appointment in: 9 months  Any Other Special Instructions Will Be Listed Below (If Applicable).     If you need a refill on your cardiac medications before your next appointment, please call your pharmacy.   [facility]  Tyden Kann A, RN, BSN  

## 2018-03-19 ENCOUNTER — Ambulatory Visit: Payer: Medicare Other | Admitting: Cardiology
